# Patient Record
Sex: Female | Born: 1945
Health system: Southern US, Community
[De-identification: ages and names within clinical notes are randomized; demographics above are authoritative.]

## PROBLEM LIST (undated history)

## (undated) DIAGNOSIS — G4700 Insomnia, unspecified: Secondary | ICD-10-CM

## (undated) DIAGNOSIS — E119 Type 2 diabetes mellitus without complications: Secondary | ICD-10-CM

## (undated) DIAGNOSIS — I10 Essential (primary) hypertension: Secondary | ICD-10-CM

## (undated) DIAGNOSIS — M810 Age-related osteoporosis without current pathological fracture: Secondary | ICD-10-CM

---

## 2007-03-28 ENCOUNTER — Ambulatory Visit: Payer: Self-pay | Admitting: Hematology and Oncology

## 2007-04-08 LAB — CBC & DIFF AND RETIC
Basophils Absolute: 0.1 10*3/uL (ref 0.0–0.1)
Eosinophils Absolute: 0.4 10*3/uL (ref 0.0–0.5)
LYMPH%: 26.7 % (ref 14.0–48.0)
MCV: 86.7 fL (ref 81.0–101.0)
MONO%: 7 % (ref 0.0–13.0)
NEUT#: 5 10*3/uL (ref 1.5–6.5)
NEUT%: 61 % (ref 39.6–76.8)
Platelets: 365 10*3/uL (ref 145–400)
RBC: 4.63 10*6/uL (ref 3.70–5.32)
Retic %: 1.2 % (ref 0.4–2.3)

## 2007-04-11 LAB — INTRINSIC FACTOR ANTIBODIES: Intrinsic Factor: POSITIVE — AB

## 2007-04-12 LAB — PROTEIN ELECTROPHORESIS, SERUM
Alpha-2-Globulin: 9.7 % (ref 7.1–11.8)
Gamma Globulin: 12.7 % (ref 11.1–18.8)

## 2007-04-12 LAB — IRON AND TIBC
%SAT: 15 % — ABNORMAL LOW (ref 20–55)
Iron: 60 ug/dL (ref 42–145)
TIBC: 402 ug/dL (ref 250–470)
UIBC: 342 ug/dL

## 2007-04-12 LAB — COMPREHENSIVE METABOLIC PANEL
ALT: 25 U/L (ref 0–35)
Albumin: 5.3 g/dL — ABNORMAL HIGH (ref 3.5–5.2)
Alkaline Phosphatase: 83 U/L (ref 39–117)
BUN: 12 mg/dL (ref 6–23)
Calcium: 10.3 mg/dL (ref 8.4–10.5)
Glucose, Bld: 117 mg/dL — ABNORMAL HIGH (ref 70–99)
Total Bilirubin: 1 mg/dL (ref 0.3–1.2)
Total Protein: 7.7 g/dL (ref 6.0–8.3)

## 2007-04-12 LAB — FERRITIN: Ferritin: 42 ng/mL (ref 10–291)

## 2007-07-29 ENCOUNTER — Ambulatory Visit: Payer: Self-pay | Admitting: Hematology and Oncology

## 2007-08-02 LAB — CBC WITH DIFFERENTIAL/PLATELET
BASO%: 0.7 % (ref 0.0–2.0)
Eosinophils Absolute: 0.4 10*3/uL (ref 0.0–0.5)
HCT: 37 % (ref 34.8–46.6)
LYMPH%: 28.5 % (ref 14.0–48.0)
MCHC: 35.3 g/dL (ref 32.0–36.0)
MCV: 89.8 fL (ref 81.0–101.0)
MONO#: 0.4 10*3/uL (ref 0.1–0.9)
NEUT%: 60.3 % (ref 39.6–76.8)
Platelets: 334 10*3/uL (ref 145–400)
WBC: 7.7 10*3/uL (ref 3.9–10.0)

## 2007-08-03 LAB — BASIC METABOLIC PANEL
CO2: 26 mEq/L (ref 19–32)
Calcium: 9.8 mg/dL (ref 8.4–10.5)
Creatinine, Ser: 0.82 mg/dL (ref 0.40–1.20)
Glucose, Bld: 110 mg/dL — ABNORMAL HIGH (ref 70–99)

## 2007-08-03 LAB — FOLATE RBC: RBC Folate: 954 ng/mL — ABNORMAL HIGH (ref 180–600)

## 2011-07-28 ENCOUNTER — Other Ambulatory Visit (HOSPITAL_COMMUNITY): Payer: Self-pay | Admitting: *Deleted

## 2011-07-31 ENCOUNTER — Ambulatory Visit (HOSPITAL_COMMUNITY)
Admission: RE | Admit: 2011-07-31 | Discharge: 2011-07-31 | Disposition: A | Discharge status: Home / Self Care | Payer: BC Managed Care – PPO | Source: Ambulatory Visit | Attending: Internal Medicine | Admitting: Internal Medicine

## 2011-07-31 DIAGNOSIS — M810 Age-related osteoporosis without current pathological fracture: Secondary | ICD-10-CM | POA: Insufficient documentation

## 2011-07-31 MED ORDER — ZOLEDRONIC ACID 5 MG/100ML IV SOLN
5.0000 mg | Freq: Once | INTRAVENOUS | Status: AC
  Administered 2011-07-31: 5 mg via INTRAVENOUS
  Filled 2011-07-31: qty 100

## 2011-07-31 NOTE — Progress Notes (Signed)
1035- Attempted two PIV, one in left antecubital and one in right forearm.  Both attempts unsuccessful.  Another RN to assess.

## 2011-07-31 NOTE — Progress Notes (Signed)
Right FA PIV attempted without success

## 2011-08-03 ENCOUNTER — Other Ambulatory Visit (HOSPITAL_COMMUNITY): Payer: Self-pay | Admitting: *Deleted

## 2011-08-03 MED ORDER — SODIUM CHLORIDE 0.9 % IV SOLN: INTRAVENOUS | Status: AC

## 2011-08-04 ENCOUNTER — Encounter (HOSPITAL_COMMUNITY): Admission: RE | Admit: 2011-08-04 | Payer: BC Managed Care – PPO | Source: Ambulatory Visit

## 2011-08-04 ENCOUNTER — Other Ambulatory Visit (HOSPITAL_COMMUNITY): Payer: Self-pay | Admitting: *Deleted

## 2012-06-01 ENCOUNTER — Other Ambulatory Visit: Payer: Self-pay | Admitting: Obstetrics and Gynecology

## 2012-06-01 DIAGNOSIS — R928 Other abnormal and inconclusive findings on diagnostic imaging of breast: Secondary | ICD-10-CM

## 2012-06-03 ENCOUNTER — Ambulatory Visit
Admission: RE | Admit: 2012-06-03 | Discharge: 2012-06-03 | Disposition: A | Discharge status: Home / Self Care | Payer: BC Managed Care – PPO | Source: Ambulatory Visit | Attending: Obstetrics and Gynecology | Admitting: Obstetrics and Gynecology

## 2012-06-03 DIAGNOSIS — R928 Other abnormal and inconclusive findings on diagnostic imaging of breast: Secondary | ICD-10-CM

## 2012-10-07 ENCOUNTER — Other Ambulatory Visit (HOSPITAL_COMMUNITY): Payer: Self-pay | Admitting: Internal Medicine

## 2012-10-11 ENCOUNTER — Encounter (HOSPITAL_COMMUNITY): Payer: Self-pay

## 2012-10-11 ENCOUNTER — Ambulatory Visit (HOSPITAL_COMMUNITY)
Admission: RE | Admit: 2012-10-11 | Discharge: 2012-10-11 | Disposition: A | Discharge status: Home / Self Care | Payer: BC Managed Care – PPO | Source: Ambulatory Visit | Attending: Internal Medicine | Admitting: Internal Medicine

## 2012-10-11 DIAGNOSIS — M810 Age-related osteoporosis without current pathological fracture: Secondary | ICD-10-CM | POA: Insufficient documentation

## 2012-10-11 HISTORY — DX: Insomnia, unspecified: G47.00

## 2012-10-11 HISTORY — DX: Age-related osteoporosis without current pathological fracture: M81.0

## 2012-10-11 HISTORY — DX: Essential (primary) hypertension: I10

## 2012-10-11 HISTORY — DX: Type 2 diabetes mellitus without complications: E11.9

## 2012-10-11 MED ORDER — SODIUM CHLORIDE 0.9 % IV SOLN
Freq: Once | INTRAVENOUS | Status: AC
  Administered 2012-10-11: 12:00:00 via INTRAVENOUS

## 2012-10-11 MED ORDER — ZOLEDRONIC ACID 5 MG/100ML IV SOLN
5.0000 mg | Freq: Once | INTRAVENOUS | Status: AC
  Administered 2012-10-11: 5 mg via INTRAVENOUS
  Filled 2012-10-11: qty 100

## 2012-10-13 ENCOUNTER — Other Ambulatory Visit (HOSPITAL_COMMUNITY): Payer: Self-pay | Admitting: Internal Medicine

## 2012-10-17 ENCOUNTER — Ambulatory Visit (HOSPITAL_COMMUNITY): Payer: BC Managed Care – PPO

## 2014-07-02 ENCOUNTER — Encounter (HOSPITAL_COMMUNITY): Payer: BC Managed Care – PPO

## 2017-08-20 ENCOUNTER — Encounter: Payer: Self-pay | Admitting: Family Medicine

## 2017-08-20 ENCOUNTER — Ambulatory Visit: Payer: Medicare Other | Admitting: Family Medicine

## 2017-08-20 VITALS — BP 124/68 | HR 82 | Temp 98.6°F | Ht 64.5 in | Wt 126.0 lb

## 2017-08-20 DIAGNOSIS — M7591 Shoulder lesion, unspecified, right shoulder: Secondary | ICD-10-CM | POA: Diagnosis not present

## 2017-08-20 DIAGNOSIS — M19019 Primary osteoarthritis, unspecified shoulder: Secondary | ICD-10-CM

## 2017-08-20 MED ORDER — NITROGLYCERIN 0.2 MG/HR TD PT24: MEDICATED_PATCH | TRANSDERMAL | 11 refills | Status: AC

## 2017-08-20 NOTE — Progress Notes (Signed)
Lucendia Leard - 72 y.o. female MRN [id number]  Date of birth: [birth year]  SUBJECTIVE:  Including CC & ROS.  Chief Complaint  Patient presents with  . Right shoulder pain    Sherrice Creekmore is a 72 y.o. female that is presenting with right shoulder pain. Pain present since October. Pain is located diffusely throughout her shoulder. She has been seeing a Physical therapist for the neck and shoulder pain. Denies surgeries or injuries to the area. Pain more intense upon lifting her arm with any weight in it. She is active and plays pickleball. The pain is sharp and moderate. Has some pain with overhead activities. No radicular symptoms. No bruising or swelling. Has not taken any medications. Pain is intermittent in nature.    Review of Systems  Constitutional: Negative for fever.  Respiratory: Negative for shortness of breath.   Gastrointestinal: Negative for abdominal pain.  Musculoskeletal: Negative for back pain.  Skin: Negative for color change.  Allergic/Immunologic: Negative for immunocompromised state.  Neurological: Negative for weakness.  Hematological: Negative for adenopathy.  Psychiatric/Behavioral: Negative for agitation.    HISTORY: Past Medical, Surgical, Social, and Family History Reviewed & Updated per EMR.   Pertinent Historical Findings include:  Past Medical History:  Diagnosis Date  . Diabetes (HCC)   . Hypertension   . Insomnia   . Osteoporosis     History reviewed. No pertinent surgical history.  Allergies  Allergen Reactions  . Codeine Nausea Only  . Crestor [Rosuvastatin Calcium] Nausea Only  . Lipitor [Atorvastatin Calcium] Other (See Comments)    Liver muscle problems  . Metformin And Related Other (See Comments)    Bocks iron and b12  . Tramadol Other (See Comments)    vertigo  . Uncoded Nonscreenable Allergen     opiods and polysporin  . Amoxicillin-Pot Clavulanate Rash  . Penicillins Rash    Taken from dr Leandro Reasoner faxed note (pt states only to  augmentin  . Polysporin [Bacitracin-Polymyxin B] Itching and Rash    Taken from dr Leandro Reasoner faxed notes/     History reviewed. No pertinent family history.   Social History   Socioeconomic History  . Marital status: Married    Spouse name: Not on file  . Number of children: Not on file  . Years of education: Not on file  . Highest education level: Not on file  Social Needs  . Financial resource strain: Not on file  . Food insecurity - worry: Not on file  . Food insecurity - inability: Not on file  . Transportation needs - medical: Not on file  . Transportation needs - non-medical: Not on file  Occupational History  . Not on file  Tobacco Use  . Smoking status: Never Smoker  . Smokeless tobacco: Never Used  Substance and Sexual Activity  . Alcohol use: No    Frequency: Never  . Drug use: No  . Sexual activity: Not on file  Other Topics Concern  . Not on file  Social History Narrative  . Not on file     PHYSICAL EXAM:  VS: BP 124/68 (BP Location: Left Arm, Patient Position: Sitting, Cuff Size: Normal)   Pulse 82   Temp 98.6 F (37 C) (Oral)   Ht 5' 4.5" (1.638 m)   Wt 126 lb (57.2 kg)   SpO2 100%   BMI 21.29 kg/m  Physical Exam Gen: NAD, alert, cooperative with exam, well-appearing ENT: normal lips, normal nasal mucosa,  Eye: normal EOM, normal conjunctiva and lids CV:  no edema, +2 pedal pulses   Resp: no accessory muscle use, non-labored,  Skin: no rashes, no areas of induration  Neuro: normal tone, normal sensation to touch Psych:  normal insight, alert and oriented MSK:  Right Shoulder:  Normal active flexion and abduction  Normal ER and abduction  Normal strength to resistance with IR and ER  Normal Empty can testing  Normal Speed's test.  Neurovascularly intact.   Limited ultrasound: right shoulder:  BT was normal in short axis and in dynamic testing  Normal subscapularis and infraspinatus.  Chronic changes of the supraspinatus to suggest  tendinopathy. No obvious enlarged bursa  Normal posterior GH  AC joint with degenerative changes and effusion   Summary: supraspinatus tendinopathy and AC joint arthritis.   Ultrasound and interpretation by Clare GandyJeremy Felma Pfefferle, MD       Aspiration/Injection Procedure Note Elyn Peersancy Kurdziel [birth year]  Procedure: Injection Indications: right shoulder pain  Procedure Details Consent: Risks of procedure as well as the alternatives and risks of each were explained to the (patient/caregiver).  Consent for procedure obtained. Time Out: Verified patient identification, verified procedure, site/side was marked, verified correct patient position, special equipment/implants available, medications/allergies/relevent history reviewed, required imaging and test results available.  Performed.  The area was cleaned with iodine and alcohol swabs.    The right AC joint was injected using 0.5 cc's of 40 mg Depomedrol and 4 cc's of 1% lidocaine with a 22 1 1/2" needle.  Ultrasound was used. Images were obtained in  Long views showing the injection.    A sterile dressing was applied.  Patient did tolerate procedure well.        ASSESSMENT & PLAN:   Right supraspinatus tendinitis Appears that she has some tendinopathy on ultrasound.  Exam was strong throughout. -Nitro -Counseled on home exercises -Follow-up in 4-6 weeks to rescan.  AC joint arthropathy Pain with overhead activities and changes on US.  - AC joint injection today  - could try a topical NSAID.

## 2017-08-20 NOTE — Patient Instructions (Signed)

## 2017-08-20 NOTE — Assessment & Plan Note (Signed)
Pain with overhead activities and changes on US.  - AC joint injection today  - could try a topical NSAID.

## 2017-08-20 NOTE — Assessment & Plan Note (Signed)
Appears that she has some tendinopathy on ultrasound.  Exam was strong throughout. -Nitro -Counseled on home exercises -Follow-up in 4-6 weeks to rescan.

## 2017-09-17 ENCOUNTER — Encounter: Payer: Self-pay | Admitting: Family Medicine

## 2017-09-17 ENCOUNTER — Ambulatory Visit: Payer: Medicare Other | Admitting: Family Medicine

## 2017-09-17 VITALS — BP 110/66 | HR 78 | Temp 97.7°F | Ht 64.5 in | Wt 128.0 lb

## 2017-09-17 DIAGNOSIS — M7591 Shoulder lesion, unspecified, right shoulder: Secondary | ICD-10-CM

## 2017-09-17 DIAGNOSIS — M19019 Primary osteoarthritis, unspecified shoulder: Secondary | ICD-10-CM | POA: Diagnosis not present

## 2017-09-17 NOTE — Progress Notes (Signed)
Jillian Peersancy Junker - 72 y.o. female MRN [id number]  Date of birth: 1946-07-14  SUBJECTIVE:  Including CC & ROS.  Chief Complaint  Patient presents with  . Follow-up    Jillian Simon is a 72 y.o. female that is here for right supraspinatus tendinopathy and AC joint injection follow up. Patient states pain is improved. She has been using Nitro patches and exercises with improvement. She hasn't been playing pickle ball. Has noticed improvement in her range of motion. Has been working with PT.   Review of Systems  Constitutional: Negative for fever.  HENT: Negative for ear pain.   Respiratory: Negative for cough.   Cardiovascular: Negative for chest pain.  Gastrointestinal: Negative for abdominal pain.  Musculoskeletal: Negative for back pain.  Skin: Negative for color change.  Neurological: Negative for weakness.  Hematological: Negative for adenopathy.  Psychiatric/Behavioral: Negative for agitation.    HISTORY: Past Medical, Surgical, Social, and Family History Reviewed & Updated per EMR.   Pertinent Historical Findings include:  Past Medical History:  Diagnosis Date  . Diabetes (HCC)   . Hypertension   . Insomnia   . Osteoporosis     No past surgical history on file.  Allergies  Allergen Reactions  . Codeine Nausea Only  . Crestor [Rosuvastatin Calcium] Nausea Only  . Lipitor [Atorvastatin Calcium] Other (See Comments)    Liver muscle problems  . Metformin And Related Other (See Comments)    Bocks iron and b12  . Tramadol Other (See Comments)    vertigo  . Uncoded Nonscreenable Allergen     opiods and polysporin  . Amoxicillin-Pot Clavulanate Rash  . Penicillins Rash    Taken from dr Leandro Reasonershaws faxed note (pt states only to augmentin  . Polysporin [Bacitracin-Polymyxin B] Itching and Rash    Taken from dr Leandro Reasonershaws faxed notes/     No family history on file.   Social History   Socioeconomic History  . Marital status: Married    Spouse name: Not on file  . Number of  children: Not on file  . Years of education: Not on file  . Highest education level: Not on file  Social Needs  . Financial resource strain: Not on file  . Food insecurity - worry: Not on file  . Food insecurity - inability: Not on file  . Transportation needs - medical: Not on file  . Transportation needs - non-medical: Not on file  Occupational History  . Not on file  Tobacco Use  . Smoking status: Never Smoker  . Smokeless tobacco: Never Used  Substance and Sexual Activity  . Alcohol use: No    Frequency: Never  . Drug use: No  . Sexual activity: Not on file  Other Topics Concern  . Not on file  Social History Narrative  . Not on file     PHYSICAL EXAM:  VS: BP 110/66 (BP Location: Left Arm, Patient Position: Sitting, Cuff Size: Normal)   Pulse 78   Temp 97.7 F (36.5 C) (Oral)   Ht 5' 4.5" (1.638 m)   Wt 128 lb (58.1 kg)   SpO2 97%   BMI 21.63 kg/m  Physical Exam Gen: NAD, alert, cooperative with exam, well-appearing ENT: normal lips, normal nasal mucosa,  Eye: normal EOM, normal conjunctiva and lids CV:  no edema, +2 pedal pulses   Resp: no accessory muscle use, non-labored,  GI: no masses or tenderness, no hernia  Skin: no rashes, no areas of induration  Neuro: normal tone, normal sensation to touch  Psych:  normal insight, alert and oriented MSK:  Right shoulder:  No TTP of the AC joint  Normal ROM  Normal strength to resistance with IR and ER  Normal Hawkin's test  Normal cross arm test  Neurovascularly intact   Limited ultrasound: right and left shoulder:  Right shoulder:  Normal appearing BT, subscapularis and supraspinatus  AC without significant effusion   Left shoulder:  Normal appearing supraspinatus  AC joint with arthropathy and mild effusion   Summary: normal appearing right shoulder.   Ultrasound and interpretation by Clare Gandy, MD        ASSESSMENT & PLAN:   AC joint arthropathy Significant improvement with injection   - provided Pennsaid  - f/u PRN   Right supraspinatus tendinitis Has had improvement with nitro patches  - continue nitro patches for total of 3 months - continue with exercises  - f/u PRN

## 2017-09-17 NOTE — Patient Instructions (Signed)
Please try to perform the shoulder exercises at least every other day  Please try the Pennsaid if your shoulder pain flares up  You can use the nitro patches up to three months.  Follow up with me if you feel your pain worsens or changes.  Have a good trip to the beach.

## 2017-09-17 NOTE — Assessment & Plan Note (Addendum)
Significant improvement with injection  - provided Pennsaid  - f/u PRN

## 2017-09-17 NOTE — Assessment & Plan Note (Signed)
Has had improvement with nitro patches  - continue nitro patches for total of 3 months - continue with exercises  - f/u PRN

## 2018-08-18 ENCOUNTER — Encounter (INDEPENDENT_AMBULATORY_CARE_PROVIDER_SITE_OTHER): Payer: Medicare Other | Admitting: Ophthalmology

## 2018-08-18 DIAGNOSIS — H35033 Hypertensive retinopathy, bilateral: Secondary | ICD-10-CM | POA: Diagnosis not present

## 2018-08-18 DIAGNOSIS — H353132 Nonexudative age-related macular degeneration, bilateral, intermediate dry stage: Secondary | ICD-10-CM

## 2018-08-18 DIAGNOSIS — H43813 Vitreous degeneration, bilateral: Secondary | ICD-10-CM

## 2018-08-18 DIAGNOSIS — I10 Essential (primary) hypertension: Secondary | ICD-10-CM | POA: Diagnosis not present

## 2019-07-21 ENCOUNTER — Other Ambulatory Visit: Payer: Self-pay

## 2019-07-21 ENCOUNTER — Encounter (INDEPENDENT_AMBULATORY_CARE_PROVIDER_SITE_OTHER): Payer: Medicare Other | Admitting: Ophthalmology

## 2019-07-21 DIAGNOSIS — H43813 Vitreous degeneration, bilateral: Secondary | ICD-10-CM | POA: Diagnosis not present

## 2019-07-21 DIAGNOSIS — H353132 Nonexudative age-related macular degeneration, bilateral, intermediate dry stage: Secondary | ICD-10-CM

## 2019-07-21 DIAGNOSIS — H35033 Hypertensive retinopathy, bilateral: Secondary | ICD-10-CM

## 2019-07-21 DIAGNOSIS — I10 Essential (primary) hypertension: Secondary | ICD-10-CM

## 2019-08-24 ENCOUNTER — Encounter (INDEPENDENT_AMBULATORY_CARE_PROVIDER_SITE_OTHER): Payer: Medicare Other | Admitting: Ophthalmology

## 2019-09-12 ENCOUNTER — Ambulatory Visit: Payer: Medicare Other

## 2019-09-29 ENCOUNTER — Ambulatory Visit: Payer: Medicare Other

## 2019-11-20 ENCOUNTER — Other Ambulatory Visit: Payer: Self-pay

## 2019-11-20 ENCOUNTER — Encounter (INDEPENDENT_AMBULATORY_CARE_PROVIDER_SITE_OTHER): Payer: Medicare PPO | Admitting: Ophthalmology

## 2019-11-20 DIAGNOSIS — I10 Essential (primary) hypertension: Secondary | ICD-10-CM

## 2019-11-20 DIAGNOSIS — H35033 Hypertensive retinopathy, bilateral: Secondary | ICD-10-CM | POA: Diagnosis not present

## 2019-11-20 DIAGNOSIS — H43813 Vitreous degeneration, bilateral: Secondary | ICD-10-CM

## 2019-11-20 DIAGNOSIS — H353132 Nonexudative age-related macular degeneration, bilateral, intermediate dry stage: Secondary | ICD-10-CM

## 2020-01-18 DIAGNOSIS — H40051 Ocular hypertension, right eye: Secondary | ICD-10-CM | POA: Diagnosis not present

## 2020-01-18 DIAGNOSIS — H40023 Open angle with borderline findings, high risk, bilateral: Secondary | ICD-10-CM | POA: Diagnosis not present

## 2020-01-31 DIAGNOSIS — M25512 Pain in left shoulder: Secondary | ICD-10-CM | POA: Diagnosis not present

## 2020-01-31 DIAGNOSIS — M542 Cervicalgia: Secondary | ICD-10-CM | POA: Diagnosis not present

## 2020-02-14 DIAGNOSIS — H353132 Nonexudative age-related macular degeneration, bilateral, intermediate dry stage: Secondary | ICD-10-CM | POA: Diagnosis not present

## 2020-02-21 DIAGNOSIS — M25512 Pain in left shoulder: Secondary | ICD-10-CM | POA: Diagnosis not present

## 2020-02-21 DIAGNOSIS — M542 Cervicalgia: Secondary | ICD-10-CM | POA: Diagnosis not present

## 2020-03-13 DIAGNOSIS — M542 Cervicalgia: Secondary | ICD-10-CM | POA: Diagnosis not present

## 2020-03-13 DIAGNOSIS — M25512 Pain in left shoulder: Secondary | ICD-10-CM | POA: Diagnosis not present

## 2020-03-15 DIAGNOSIS — H353132 Nonexudative age-related macular degeneration, bilateral, intermediate dry stage: Secondary | ICD-10-CM | POA: Diagnosis not present

## 2020-04-03 DIAGNOSIS — M542 Cervicalgia: Secondary | ICD-10-CM | POA: Diagnosis not present

## 2020-04-03 DIAGNOSIS — M25512 Pain in left shoulder: Secondary | ICD-10-CM | POA: Diagnosis not present

## 2020-04-14 DIAGNOSIS — H353132 Nonexudative age-related macular degeneration, bilateral, intermediate dry stage: Secondary | ICD-10-CM | POA: Diagnosis not present

## 2020-04-26 DIAGNOSIS — M25512 Pain in left shoulder: Secondary | ICD-10-CM | POA: Diagnosis not present

## 2020-04-26 DIAGNOSIS — M542 Cervicalgia: Secondary | ICD-10-CM | POA: Diagnosis not present

## 2020-05-01 DIAGNOSIS — Z08 Encounter for follow-up examination after completed treatment for malignant neoplasm: Secondary | ICD-10-CM | POA: Diagnosis not present

## 2020-05-01 DIAGNOSIS — D229 Melanocytic nevi, unspecified: Secondary | ICD-10-CM | POA: Diagnosis not present

## 2020-05-01 DIAGNOSIS — L821 Other seborrheic keratosis: Secondary | ICD-10-CM | POA: Diagnosis not present

## 2020-05-01 DIAGNOSIS — Z85828 Personal history of other malignant neoplasm of skin: Secondary | ICD-10-CM | POA: Diagnosis not present

## 2020-05-01 DIAGNOSIS — I781 Nevus, non-neoplastic: Secondary | ICD-10-CM | POA: Diagnosis not present

## 2020-05-06 DIAGNOSIS — H04322 Acute dacryocystitis of left lacrimal passage: Secondary | ICD-10-CM | POA: Diagnosis not present

## 2020-05-14 DIAGNOSIS — H353132 Nonexudative age-related macular degeneration, bilateral, intermediate dry stage: Secondary | ICD-10-CM | POA: Diagnosis not present

## 2020-05-15 DIAGNOSIS — M542 Cervicalgia: Secondary | ICD-10-CM | POA: Diagnosis not present

## 2020-05-15 DIAGNOSIS — M25512 Pain in left shoulder: Secondary | ICD-10-CM | POA: Diagnosis not present

## 2020-05-21 ENCOUNTER — Encounter (INDEPENDENT_AMBULATORY_CARE_PROVIDER_SITE_OTHER): Payer: Medicare PPO | Admitting: Ophthalmology

## 2020-05-21 ENCOUNTER — Other Ambulatory Visit: Payer: Self-pay

## 2020-05-21 DIAGNOSIS — H35033 Hypertensive retinopathy, bilateral: Secondary | ICD-10-CM

## 2020-05-21 DIAGNOSIS — I10 Essential (primary) hypertension: Secondary | ICD-10-CM

## 2020-05-21 DIAGNOSIS — H353132 Nonexudative age-related macular degeneration, bilateral, intermediate dry stage: Secondary | ICD-10-CM

## 2020-05-21 DIAGNOSIS — H43813 Vitreous degeneration, bilateral: Secondary | ICD-10-CM | POA: Diagnosis not present

## 2020-05-28 DIAGNOSIS — E1129 Type 2 diabetes mellitus with other diabetic kidney complication: Secondary | ICD-10-CM | POA: Diagnosis not present

## 2020-05-28 DIAGNOSIS — Z0000 Encounter for general adult medical examination without abnormal findings: Secondary | ICD-10-CM | POA: Diagnosis not present

## 2020-05-28 DIAGNOSIS — D649 Anemia, unspecified: Secondary | ICD-10-CM | POA: Diagnosis not present

## 2020-05-28 DIAGNOSIS — E785 Hyperlipidemia, unspecified: Secondary | ICD-10-CM | POA: Diagnosis not present

## 2020-05-28 DIAGNOSIS — I10 Essential (primary) hypertension: Secondary | ICD-10-CM | POA: Diagnosis not present

## 2020-05-28 DIAGNOSIS — E538 Deficiency of other specified B group vitamins: Secondary | ICD-10-CM | POA: Diagnosis not present

## 2020-06-05 DIAGNOSIS — M542 Cervicalgia: Secondary | ICD-10-CM | POA: Diagnosis not present

## 2020-06-05 DIAGNOSIS — M25512 Pain in left shoulder: Secondary | ICD-10-CM | POA: Diagnosis not present

## 2020-06-06 DIAGNOSIS — E1129 Type 2 diabetes mellitus with other diabetic kidney complication: Secondary | ICD-10-CM | POA: Diagnosis not present

## 2020-06-06 DIAGNOSIS — R82998 Other abnormal findings in urine: Secondary | ICD-10-CM | POA: Diagnosis not present

## 2020-06-06 DIAGNOSIS — Z1331 Encounter for screening for depression: Secondary | ICD-10-CM | POA: Diagnosis not present

## 2020-06-06 DIAGNOSIS — I10 Essential (primary) hypertension: Secondary | ICD-10-CM | POA: Diagnosis not present

## 2020-06-06 DIAGNOSIS — R1319 Other dysphagia: Secondary | ICD-10-CM | POA: Diagnosis not present

## 2020-06-06 DIAGNOSIS — Z23 Encounter for immunization: Secondary | ICD-10-CM | POA: Diagnosis not present

## 2020-06-06 DIAGNOSIS — Z0000 Encounter for general adult medical examination without abnormal findings: Secondary | ICD-10-CM | POA: Diagnosis not present

## 2020-06-06 DIAGNOSIS — K581 Irritable bowel syndrome with constipation: Secondary | ICD-10-CM | POA: Diagnosis not present

## 2020-06-06 DIAGNOSIS — E785 Hyperlipidemia, unspecified: Secondary | ICD-10-CM | POA: Diagnosis not present

## 2020-06-06 DIAGNOSIS — Z1339 Encounter for screening examination for other mental health and behavioral disorders: Secondary | ICD-10-CM | POA: Diagnosis not present

## 2020-06-06 DIAGNOSIS — E538 Deficiency of other specified B group vitamins: Secondary | ICD-10-CM | POA: Diagnosis not present

## 2020-06-06 DIAGNOSIS — E611 Iron deficiency: Secondary | ICD-10-CM | POA: Diagnosis not present

## 2020-06-06 DIAGNOSIS — Z1212 Encounter for screening for malignant neoplasm of rectum: Secondary | ICD-10-CM | POA: Diagnosis not present

## 2020-06-06 DIAGNOSIS — R809 Proteinuria, unspecified: Secondary | ICD-10-CM | POA: Diagnosis not present

## 2020-06-06 DIAGNOSIS — M8580 Other specified disorders of bone density and structure, unspecified site: Secondary | ICD-10-CM | POA: Diagnosis not present

## 2020-06-13 DIAGNOSIS — H353132 Nonexudative age-related macular degeneration, bilateral, intermediate dry stage: Secondary | ICD-10-CM | POA: Diagnosis not present

## 2020-06-21 DIAGNOSIS — R195 Other fecal abnormalities: Secondary | ICD-10-CM | POA: Diagnosis not present

## 2020-06-21 DIAGNOSIS — D500 Iron deficiency anemia secondary to blood loss (chronic): Secondary | ICD-10-CM | POA: Diagnosis not present

## 2020-06-21 DIAGNOSIS — R1013 Epigastric pain: Secondary | ICD-10-CM | POA: Diagnosis not present

## 2020-06-26 DIAGNOSIS — M542 Cervicalgia: Secondary | ICD-10-CM | POA: Diagnosis not present

## 2020-06-26 DIAGNOSIS — Z1159 Encounter for screening for other viral diseases: Secondary | ICD-10-CM | POA: Diagnosis not present

## 2020-07-01 DIAGNOSIS — D509 Iron deficiency anemia, unspecified: Secondary | ICD-10-CM | POA: Diagnosis not present

## 2020-07-01 DIAGNOSIS — R195 Other fecal abnormalities: Secondary | ICD-10-CM | POA: Diagnosis not present

## 2020-07-13 DIAGNOSIS — H353132 Nonexudative age-related macular degeneration, bilateral, intermediate dry stage: Secondary | ICD-10-CM | POA: Diagnosis not present

## 2020-07-17 DIAGNOSIS — M542 Cervicalgia: Secondary | ICD-10-CM | POA: Diagnosis not present

## 2020-07-18 DIAGNOSIS — H353132 Nonexudative age-related macular degeneration, bilateral, intermediate dry stage: Secondary | ICD-10-CM | POA: Diagnosis not present

## 2020-07-18 DIAGNOSIS — H3554 Dystrophies primarily involving the retinal pigment epithelium: Secondary | ICD-10-CM | POA: Diagnosis not present

## 2020-07-18 DIAGNOSIS — H40023 Open angle with borderline findings, high risk, bilateral: Secondary | ICD-10-CM | POA: Diagnosis not present

## 2020-07-18 DIAGNOSIS — E119 Type 2 diabetes mellitus without complications: Secondary | ICD-10-CM | POA: Diagnosis not present

## 2020-07-24 DIAGNOSIS — D649 Anemia, unspecified: Secondary | ICD-10-CM | POA: Diagnosis not present

## 2020-07-24 DIAGNOSIS — D500 Iron deficiency anemia secondary to blood loss (chronic): Secondary | ICD-10-CM | POA: Diagnosis not present

## 2020-08-05 DIAGNOSIS — M542 Cervicalgia: Secondary | ICD-10-CM | POA: Diagnosis not present

## 2020-08-12 DIAGNOSIS — H353132 Nonexudative age-related macular degeneration, bilateral, intermediate dry stage: Secondary | ICD-10-CM | POA: Diagnosis not present

## 2020-08-26 DIAGNOSIS — M542 Cervicalgia: Secondary | ICD-10-CM | POA: Diagnosis not present

## 2020-09-09 DIAGNOSIS — D649 Anemia, unspecified: Secondary | ICD-10-CM | POA: Diagnosis not present

## 2020-09-11 DIAGNOSIS — H353132 Nonexudative age-related macular degeneration, bilateral, intermediate dry stage: Secondary | ICD-10-CM | POA: Diagnosis not present

## 2020-09-16 DIAGNOSIS — M542 Cervicalgia: Secondary | ICD-10-CM | POA: Diagnosis not present

## 2020-09-17 DIAGNOSIS — R195 Other fecal abnormalities: Secondary | ICD-10-CM | POA: Diagnosis not present

## 2020-09-17 DIAGNOSIS — D509 Iron deficiency anemia, unspecified: Secondary | ICD-10-CM | POA: Diagnosis not present

## 2020-10-07 DIAGNOSIS — Z01812 Encounter for preprocedural laboratory examination: Secondary | ICD-10-CM | POA: Diagnosis not present

## 2020-10-07 DIAGNOSIS — M542 Cervicalgia: Secondary | ICD-10-CM | POA: Diagnosis not present

## 2020-10-10 DIAGNOSIS — D509 Iron deficiency anemia, unspecified: Secondary | ICD-10-CM | POA: Diagnosis not present

## 2020-10-11 DIAGNOSIS — H353132 Nonexudative age-related macular degeneration, bilateral, intermediate dry stage: Secondary | ICD-10-CM | POA: Diagnosis not present

## 2020-10-17 ENCOUNTER — Ambulatory Visit
Admission: RE | Admit: 2020-10-17 | Discharge: 2020-10-17 | Disposition: A | Discharge status: Home / Self Care | Payer: Medicare PPO | Source: Ambulatory Visit | Attending: Gastroenterology | Admitting: Gastroenterology

## 2020-10-17 ENCOUNTER — Other Ambulatory Visit: Payer: Self-pay | Admitting: Gastroenterology

## 2020-10-17 DIAGNOSIS — Z9889 Other specified postprocedural states: Secondary | ICD-10-CM | POA: Diagnosis not present

## 2020-10-17 DIAGNOSIS — D509 Iron deficiency anemia, unspecified: Secondary | ICD-10-CM

## 2020-10-17 DIAGNOSIS — M4186 Other forms of scoliosis, lumbar region: Secondary | ICD-10-CM | POA: Diagnosis not present

## 2020-10-17 DIAGNOSIS — M5136 Other intervertebral disc degeneration, lumbar region: Secondary | ICD-10-CM | POA: Diagnosis not present

## 2020-10-30 DIAGNOSIS — M542 Cervicalgia: Secondary | ICD-10-CM | POA: Diagnosis not present

## 2020-11-10 DIAGNOSIS — H353132 Nonexudative age-related macular degeneration, bilateral, intermediate dry stage: Secondary | ICD-10-CM | POA: Diagnosis not present

## 2020-11-19 ENCOUNTER — Other Ambulatory Visit: Payer: Self-pay

## 2020-11-19 ENCOUNTER — Encounter (INDEPENDENT_AMBULATORY_CARE_PROVIDER_SITE_OTHER): Payer: Medicare PPO | Admitting: Ophthalmology

## 2020-11-19 DIAGNOSIS — H35033 Hypertensive retinopathy, bilateral: Secondary | ICD-10-CM | POA: Diagnosis not present

## 2020-11-19 DIAGNOSIS — I10 Essential (primary) hypertension: Secondary | ICD-10-CM | POA: Diagnosis not present

## 2020-11-19 DIAGNOSIS — H353132 Nonexudative age-related macular degeneration, bilateral, intermediate dry stage: Secondary | ICD-10-CM | POA: Diagnosis not present

## 2020-11-19 DIAGNOSIS — H43813 Vitreous degeneration, bilateral: Secondary | ICD-10-CM | POA: Diagnosis not present

## 2020-11-20 DIAGNOSIS — M542 Cervicalgia: Secondary | ICD-10-CM | POA: Diagnosis not present

## 2020-12-04 DIAGNOSIS — Z6821 Body mass index (BMI) 21.0-21.9, adult: Secondary | ICD-10-CM | POA: Diagnosis not present

## 2020-12-04 DIAGNOSIS — Z01419 Encounter for gynecological examination (general) (routine) without abnormal findings: Secondary | ICD-10-CM | POA: Diagnosis not present

## 2020-12-04 DIAGNOSIS — Z1231 Encounter for screening mammogram for malignant neoplasm of breast: Secondary | ICD-10-CM | POA: Diagnosis not present

## 2020-12-10 DIAGNOSIS — H353132 Nonexudative age-related macular degeneration, bilateral, intermediate dry stage: Secondary | ICD-10-CM | POA: Diagnosis not present

## 2020-12-11 DIAGNOSIS — M542 Cervicalgia: Secondary | ICD-10-CM | POA: Diagnosis not present

## 2020-12-13 DIAGNOSIS — D649 Anemia, unspecified: Secondary | ICD-10-CM | POA: Diagnosis not present

## 2020-12-13 DIAGNOSIS — E785 Hyperlipidemia, unspecified: Secondary | ICD-10-CM | POA: Diagnosis not present

## 2020-12-13 DIAGNOSIS — E538 Deficiency of other specified B group vitamins: Secondary | ICD-10-CM | POA: Diagnosis not present

## 2020-12-13 DIAGNOSIS — M542 Cervicalgia: Secondary | ICD-10-CM | POA: Diagnosis not present

## 2020-12-13 DIAGNOSIS — E1129 Type 2 diabetes mellitus with other diabetic kidney complication: Secondary | ICD-10-CM | POA: Diagnosis not present

## 2020-12-13 DIAGNOSIS — E611 Iron deficiency: Secondary | ICD-10-CM | POA: Diagnosis not present

## 2020-12-13 DIAGNOSIS — M25511 Pain in right shoulder: Secondary | ICD-10-CM | POA: Diagnosis not present

## 2020-12-13 DIAGNOSIS — R809 Proteinuria, unspecified: Secondary | ICD-10-CM | POA: Diagnosis not present

## 2020-12-13 DIAGNOSIS — M8580 Other specified disorders of bone density and structure, unspecified site: Secondary | ICD-10-CM | POA: Diagnosis not present

## 2020-12-13 DIAGNOSIS — I10 Essential (primary) hypertension: Secondary | ICD-10-CM | POA: Diagnosis not present

## 2021-01-01 ENCOUNTER — Ambulatory Visit: Payer: Self-pay

## 2021-01-01 ENCOUNTER — Encounter: Payer: Self-pay | Admitting: Family Medicine

## 2021-01-01 ENCOUNTER — Ambulatory Visit: Payer: Medicare PPO | Admitting: Family Medicine

## 2021-01-01 ENCOUNTER — Other Ambulatory Visit: Payer: Self-pay

## 2021-01-01 VITALS — BP 132/74 | Ht 64.0 in | Wt 125.0 lb

## 2021-01-01 DIAGNOSIS — M19019 Primary osteoarthritis, unspecified shoulder: Secondary | ICD-10-CM

## 2021-01-01 DIAGNOSIS — M25511 Pain in right shoulder: Secondary | ICD-10-CM

## 2021-01-01 DIAGNOSIS — M542 Cervicalgia: Secondary | ICD-10-CM | POA: Diagnosis not present

## 2021-01-01 MED ORDER — TRIAMCINOLONE ACETONIDE 40 MG/ML IJ SUSP
40.0000 mg | Freq: Once | INTRAMUSCULAR | Status: AC
  Administered 2021-01-01: 40 mg via INTRA_ARTICULAR

## 2021-01-01 NOTE — Patient Instructions (Signed)
Good to see you Please try ice as needed   Please send me a message in MyChart with any questions or updates.  Please see me back in 4 weeks.   --Dr. Rona Tomson  

## 2021-01-01 NOTE — Assessment & Plan Note (Signed)
Symptoms seem most consistent with AC joint.  Does have an effusion on exam today. -Counseled on home exercise therapy and supportive care. -Injection today. -Could consider physical therapy or shockwave for the rotator cuff.

## 2021-01-01 NOTE — Progress Notes (Signed)
  Jillian Simon - 75 y.o. female MRN [id number]  Date of birth: [birth year]  SUBJECTIVE:  Including CC & ROS.  No chief complaint on file.   Jillian Simon is a 75 y.o. female that is presenting with right shoulder pain.  The pain is acute on chronic in nature.  Did get improvement with previous AC joint injection.  Denies any injury in the interim.   Review of Systems See HPI   HISTORY: Past Medical, Surgical, Social, and Family History Reviewed & Updated per EMR.   Pertinent Historical Findings include:  Past Medical History:  Diagnosis Date  . Diabetes (HCC)   . Hypertension   . Insomnia   . Osteoporosis     History reviewed. No pertinent surgical history.  History reviewed. No pertinent family history.  Social History   Socioeconomic History  . Marital status: Married    Spouse name: Not on file  . Number of children: Not on file  . Years of education: Not on file  . Highest education level: Not on file  Occupational History  . Not on file  Tobacco Use  . Smoking status: Never Smoker  . Smokeless tobacco: Never Used  Substance and Sexual Activity  . Alcohol use: No  . Drug use: No  . Sexual activity: Not on file  Other Topics Concern  . Not on file  Social History Narrative  . Not on file   Social Determinants of Health   Financial Resource Strain: Not on file  Food Insecurity: Not on file  Transportation Needs: Not on file  Physical Activity: Not on file  Stress: Not on file  Social Connections: Not on file  Intimate Partner Violence: Not on file     PHYSICAL EXAM:  VS: BP 132/74 (BP Location: Left Arm, Patient Position: Sitting, Cuff Size: Normal)   Ht 5\' 4"  (1.626 m)   Wt 125 lb (56.7 kg)   BMI 21.46 kg/m  Physical Exam Gen: NAD, alert, cooperative with exam, well-appearing MSK:  Right shoulder: Normal range of motion. Normal strength resistance. Neurovascular intact  Limited ultrasound: Right shoulder:  Normal-appearing biceps tendon in long  and short axis. Normal subscapularis. Mild degenerative changes at the footprint of the supraspinatus with mild bursitis. No significant change in the posterior glenohumeral joint. AC joint with effusion appreciated  Summary: AC joint arthropathy  Ultrasound and interpretation by , MD   Aspiration/Injection Procedure Note Jillian Simon 1946/08/03  Procedure: Injection Indications: Right shoulder pain  Procedure Details Consent: Risks of procedure as well as the alternatives and risks of each were explained to the (patient/caregiver).  Consent for procedure obtained. Time Out: Verified patient identification, verified procedure, site/side was marked, verified correct patient position, special equipment/implants available, medications/allergies/relevent history reviewed, required imaging and test results available.  Performed.  The area was cleaned with iodine and alcohol swabs.    The right AC joint was injected using 1 cc's of 40 mg Kenalog and 1 cc's of 0.25% bupivacaine with a 25 1 1/2" needle.  Ultrasound was used. Images were obtained in short views showing the injection.     A sterile dressing was applied.  Patient did tolerate procedure well.    ASSESSMENT & PLAN:   AC joint arthropathy Symptoms seem most consistent with AC joint.  Does have an effusion on exam today. -Counseled on home exercise therapy and supportive care. -Injection today. -Could consider physical therapy or shockwave for the rotator cuff.

## 2021-01-09 DIAGNOSIS — H353132 Nonexudative age-related macular degeneration, bilateral, intermediate dry stage: Secondary | ICD-10-CM | POA: Diagnosis not present

## 2021-01-16 DIAGNOSIS — H40023 Open angle with borderline findings, high risk, bilateral: Secondary | ICD-10-CM | POA: Diagnosis not present

## 2021-01-22 DIAGNOSIS — M542 Cervicalgia: Secondary | ICD-10-CM | POA: Diagnosis not present

## 2021-02-08 DIAGNOSIS — H353132 Nonexudative age-related macular degeneration, bilateral, intermediate dry stage: Secondary | ICD-10-CM | POA: Diagnosis not present

## 2021-02-12 DIAGNOSIS — M542 Cervicalgia: Secondary | ICD-10-CM | POA: Diagnosis not present

## 2021-02-13 ENCOUNTER — Ambulatory Visit: Payer: Medicare PPO | Admitting: Family Medicine

## 2021-03-05 DIAGNOSIS — M542 Cervicalgia: Secondary | ICD-10-CM | POA: Diagnosis not present

## 2021-03-10 DIAGNOSIS — H353132 Nonexudative age-related macular degeneration, bilateral, intermediate dry stage: Secondary | ICD-10-CM | POA: Diagnosis not present

## 2021-03-17 ENCOUNTER — Ambulatory Visit: Payer: Self-pay

## 2021-03-17 ENCOUNTER — Other Ambulatory Visit: Payer: Self-pay

## 2021-03-17 ENCOUNTER — Ambulatory Visit: Payer: Medicare PPO | Admitting: Family Medicine

## 2021-03-17 VITALS — Ht 63.5 in | Wt 124.0 lb

## 2021-03-17 DIAGNOSIS — M25511 Pain in right shoulder: Secondary | ICD-10-CM

## 2021-03-17 DIAGNOSIS — M778 Other enthesopathies, not elsewhere classified: Secondary | ICD-10-CM | POA: Insufficient documentation

## 2021-03-17 MED ORDER — TRIAMCINOLONE ACETONIDE 40 MG/ML IJ SUSP
40.0000 mg | Freq: Once | INTRAMUSCULAR | Status: AC
  Administered 2021-03-17: 40 mg via INTRA_ARTICULAR

## 2021-03-17 NOTE — Assessment & Plan Note (Signed)
Symptoms more associated with the joint.  Has limitations in range of motion.  Seems more capsular in nature as opposed to degenerative. -Counseled on home exercise therapy and supportive care. -Has physical therapy scheduled to address as well. - Injection today. -Could consider imaging.

## 2021-03-17 NOTE — Patient Instructions (Signed)
Good to see you Please try heat before exercises and ice after  Please try the exercises   Please send me a message in MyChart with any questions or updates.  Please see me back in 4-6 weeks .   --Dr. Jordan Likes

## 2021-03-17 NOTE — Progress Notes (Signed)
  Brandee Markin - 75 y.o. female MRN [id number]  Date of birth: 1945/08/19  SUBJECTIVE:  Including CC & ROS.  No chief complaint on file.   Tiphani Mells is a 75 y.o. female that is presenting with acute worsening of her right shoulder pain.  The pain is more around the joint itself.  Did get resolution with the [facility] joint injection but the pain has returned..    Review of Systems See HPI   HISTORY: Past Medical, Surgical, Social, and Family History Reviewed & Updated per EMR.   Pertinent Historical Findings include:  Past Medical History:  Diagnosis Date   Diabetes (HCC)    Hypertension    Insomnia    Osteoporosis     No past surgical history on file.  No family history on file.  Social History   Socioeconomic History   Marital status: Married    Spouse name: Not on file   Number of children: Not on file   Years of education: Not on file   Highest education level: Not on file  Occupational History   Not on file  Tobacco Use   Smoking status: Never   Smokeless tobacco: Never  Substance and Sexual Activity   Alcohol use: No   Drug use: No   Sexual activity: Not on file  Other Topics Concern   Not on file  Social History Narrative   Not on file   Social Determinants of Health   Financial Resource Strain: Not on file  Food Insecurity: Not on file  Transportation Needs: Not on file  Physical Activity: Not on file  Stress: Not on file  Social Connections: Not on file  Intimate Partner Violence: Not on file     PHYSICAL EXAM:  VS: Ht 5' 3.5" (1.613 m)   Wt 124 lb (56.2 kg)   BMI 21.62 kg/m  Physical Exam Gen: NAD, alert, cooperative with exam, well-appearing MSK:  Right shoulder: Limited external rotation. Limited external rotation and abduction. Normal empty can test. Neurovascular intact   Aspiration/Injection Procedure Note Lavilla Delamora May 18, 1946  Procedure: Injection Indications: Right shoulder pain  Procedure Details Consent: Risks of  procedure as well as the alternatives and risks of each were explained to the (patient/caregiver).  Consent for procedure obtained. Time Out: Verified patient identification, verified procedure, site/side was marked, verified correct patient position, special equipment/implants available, medications/allergies/relevent history reviewed, required imaging and test results available.  Performed.  The area was cleaned with iodine and alcohol swabs.    The right glenohumeral joint was injected using 3 cc of 1% lidocaine on a 21-gauge 2 inch needle.  The syringe was switched and a mixture containing 1 cc's of 40 mg 3 and 3 cc's of 0.25% bupivacaine, and 3 cc of 1% lidocaine were injected. Marland Kitchen  Ultrasound was used. Images were obtained in short views showing the injection.     A sterile dressing was applied.  Patient did tolerate procedure well.     ASSESSMENT & PLAN:   Capsulitis of right shoulder Symptoms more associated with the joint.  Has limitations in range of motion.  Seems more capsular in nature as opposed to degenerative. -Counseled on home exercise therapy and supportive care. -Has physical therapy scheduled to address as well. - Injection today. -Could consider imaging.

## 2021-03-26 DIAGNOSIS — M542 Cervicalgia: Secondary | ICD-10-CM | POA: Diagnosis not present

## 2021-03-26 DIAGNOSIS — M25511 Pain in right shoulder: Secondary | ICD-10-CM | POA: Diagnosis not present

## 2021-04-09 DIAGNOSIS — H353132 Nonexudative age-related macular degeneration, bilateral, intermediate dry stage: Secondary | ICD-10-CM | POA: Diagnosis not present

## 2021-04-16 DIAGNOSIS — M542 Cervicalgia: Secondary | ICD-10-CM | POA: Diagnosis not present

## 2021-04-16 DIAGNOSIS — M25511 Pain in right shoulder: Secondary | ICD-10-CM | POA: Diagnosis not present

## 2021-05-07 DIAGNOSIS — M25511 Pain in right shoulder: Secondary | ICD-10-CM | POA: Diagnosis not present

## 2021-05-07 DIAGNOSIS — M542 Cervicalgia: Secondary | ICD-10-CM | POA: Diagnosis not present

## 2021-05-09 DIAGNOSIS — H353132 Nonexudative age-related macular degeneration, bilateral, intermediate dry stage: Secondary | ICD-10-CM | POA: Diagnosis not present

## 2021-05-22 DIAGNOSIS — L821 Other seborrheic keratosis: Secondary | ICD-10-CM | POA: Diagnosis not present

## 2021-05-22 DIAGNOSIS — Z85828 Personal history of other malignant neoplasm of skin: Secondary | ICD-10-CM | POA: Diagnosis not present

## 2021-05-26 ENCOUNTER — Encounter (INDEPENDENT_AMBULATORY_CARE_PROVIDER_SITE_OTHER): Payer: Medicare PPO | Admitting: Ophthalmology

## 2021-05-26 ENCOUNTER — Other Ambulatory Visit: Payer: Self-pay

## 2021-05-26 DIAGNOSIS — I10 Essential (primary) hypertension: Secondary | ICD-10-CM

## 2021-05-26 DIAGNOSIS — H35033 Hypertensive retinopathy, bilateral: Secondary | ICD-10-CM

## 2021-05-26 DIAGNOSIS — H353132 Nonexudative age-related macular degeneration, bilateral, intermediate dry stage: Secondary | ICD-10-CM | POA: Diagnosis not present

## 2021-05-26 DIAGNOSIS — H43813 Vitreous degeneration, bilateral: Secondary | ICD-10-CM

## 2021-05-28 DIAGNOSIS — M25511 Pain in right shoulder: Secondary | ICD-10-CM | POA: Diagnosis not present

## 2021-05-28 DIAGNOSIS — M542 Cervicalgia: Secondary | ICD-10-CM | POA: Diagnosis not present

## 2021-06-08 DIAGNOSIS — H353132 Nonexudative age-related macular degeneration, bilateral, intermediate dry stage: Secondary | ICD-10-CM | POA: Diagnosis not present

## 2021-06-11 DIAGNOSIS — M25511 Pain in right shoulder: Secondary | ICD-10-CM | POA: Diagnosis not present

## 2021-06-11 DIAGNOSIS — M542 Cervicalgia: Secondary | ICD-10-CM | POA: Diagnosis not present

## 2021-06-24 DIAGNOSIS — E611 Iron deficiency: Secondary | ICD-10-CM | POA: Diagnosis not present

## 2021-06-24 DIAGNOSIS — E785 Hyperlipidemia, unspecified: Secondary | ICD-10-CM | POA: Diagnosis not present

## 2021-06-24 DIAGNOSIS — M8589 Other specified disorders of bone density and structure, multiple sites: Secondary | ICD-10-CM | POA: Diagnosis not present

## 2021-06-24 DIAGNOSIS — E538 Deficiency of other specified B group vitamins: Secondary | ICD-10-CM | POA: Diagnosis not present

## 2021-06-24 DIAGNOSIS — D649 Anemia, unspecified: Secondary | ICD-10-CM | POA: Diagnosis not present

## 2021-06-24 DIAGNOSIS — E1129 Type 2 diabetes mellitus with other diabetic kidney complication: Secondary | ICD-10-CM | POA: Diagnosis not present

## 2021-06-24 DIAGNOSIS — M859 Disorder of bone density and structure, unspecified: Secondary | ICD-10-CM | POA: Diagnosis not present

## 2021-06-26 DIAGNOSIS — M25511 Pain in right shoulder: Secondary | ICD-10-CM | POA: Diagnosis not present

## 2021-06-26 DIAGNOSIS — M542 Cervicalgia: Secondary | ICD-10-CM | POA: Diagnosis not present

## 2021-07-01 DIAGNOSIS — I10 Essential (primary) hypertension: Secondary | ICD-10-CM | POA: Diagnosis not present

## 2021-07-01 DIAGNOSIS — M25511 Pain in right shoulder: Secondary | ICD-10-CM | POA: Diagnosis not present

## 2021-07-01 DIAGNOSIS — F5101 Primary insomnia: Secondary | ICD-10-CM | POA: Diagnosis not present

## 2021-07-01 DIAGNOSIS — Z1331 Encounter for screening for depression: Secondary | ICD-10-CM | POA: Diagnosis not present

## 2021-07-01 DIAGNOSIS — Z86010 Personal history of colonic polyps: Secondary | ICD-10-CM | POA: Diagnosis not present

## 2021-07-01 DIAGNOSIS — E785 Hyperlipidemia, unspecified: Secondary | ICD-10-CM | POA: Diagnosis not present

## 2021-07-01 DIAGNOSIS — Z1339 Encounter for screening examination for other mental health and behavioral disorders: Secondary | ICD-10-CM | POA: Diagnosis not present

## 2021-07-01 DIAGNOSIS — M8580 Other specified disorders of bone density and structure, unspecified site: Secondary | ICD-10-CM | POA: Diagnosis not present

## 2021-07-01 DIAGNOSIS — E1129 Type 2 diabetes mellitus with other diabetic kidney complication: Secondary | ICD-10-CM | POA: Diagnosis not present

## 2021-07-01 DIAGNOSIS — E611 Iron deficiency: Secondary | ICD-10-CM | POA: Diagnosis not present

## 2021-07-01 DIAGNOSIS — R82998 Other abnormal findings in urine: Secondary | ICD-10-CM | POA: Diagnosis not present

## 2021-07-01 DIAGNOSIS — Z0000 Encounter for general adult medical examination without abnormal findings: Secondary | ICD-10-CM | POA: Diagnosis not present

## 2021-07-08 DIAGNOSIS — H353132 Nonexudative age-related macular degeneration, bilateral, intermediate dry stage: Secondary | ICD-10-CM | POA: Diagnosis not present

## 2021-07-21 DIAGNOSIS — M542 Cervicalgia: Secondary | ICD-10-CM | POA: Diagnosis not present

## 2021-07-21 DIAGNOSIS — M25511 Pain in right shoulder: Secondary | ICD-10-CM | POA: Diagnosis not present

## 2021-08-07 DIAGNOSIS — H353132 Nonexudative age-related macular degeneration, bilateral, intermediate dry stage: Secondary | ICD-10-CM | POA: Diagnosis not present

## 2021-08-12 DIAGNOSIS — M542 Cervicalgia: Secondary | ICD-10-CM | POA: Diagnosis not present

## 2021-08-12 DIAGNOSIS — M25511 Pain in right shoulder: Secondary | ICD-10-CM | POA: Diagnosis not present

## 2021-09-04 DIAGNOSIS — M25511 Pain in right shoulder: Secondary | ICD-10-CM | POA: Diagnosis not present

## 2021-09-04 DIAGNOSIS — M542 Cervicalgia: Secondary | ICD-10-CM | POA: Diagnosis not present

## 2021-09-06 DIAGNOSIS — H353132 Nonexudative age-related macular degeneration, bilateral, intermediate dry stage: Secondary | ICD-10-CM | POA: Diagnosis not present

## 2021-09-22 DIAGNOSIS — M542 Cervicalgia: Secondary | ICD-10-CM | POA: Diagnosis not present

## 2021-09-22 DIAGNOSIS — M25511 Pain in right shoulder: Secondary | ICD-10-CM | POA: Diagnosis not present

## 2021-10-06 DIAGNOSIS — H353132 Nonexudative age-related macular degeneration, bilateral, intermediate dry stage: Secondary | ICD-10-CM | POA: Diagnosis not present

## 2021-10-15 DIAGNOSIS — M542 Cervicalgia: Secondary | ICD-10-CM | POA: Diagnosis not present

## 2021-10-15 DIAGNOSIS — M25511 Pain in right shoulder: Secondary | ICD-10-CM | POA: Diagnosis not present

## 2021-10-25 IMAGING — CR DG ABDOMEN 1V
2 series · 2 of 2 positions shown · non-contrast
Comparison: None.

CLINICAL DATA: 74-year-old female with capsule endoscopy 1 week
ago, query retained capsule.

EXAM:
ABDOMEN - 1 VIEW

[t abdomen supine (1 of 2)]
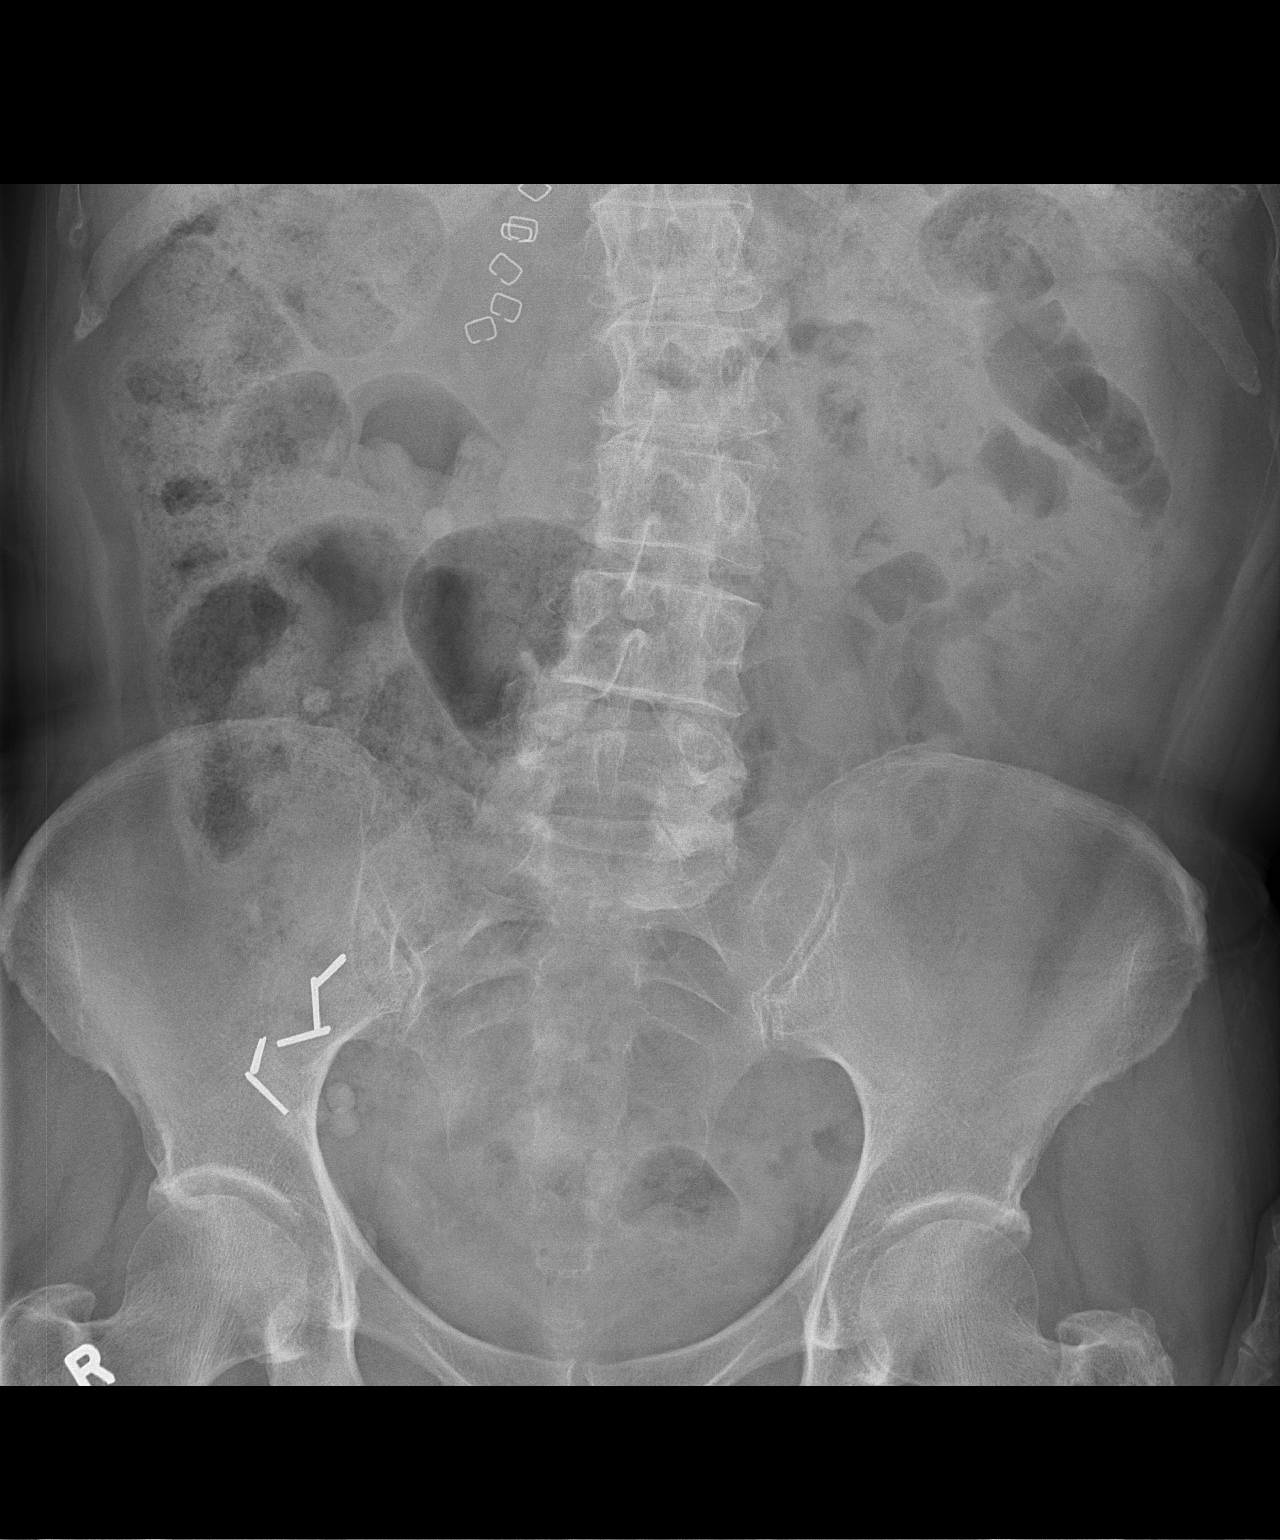

[t abdomen supine (2 of 2)]
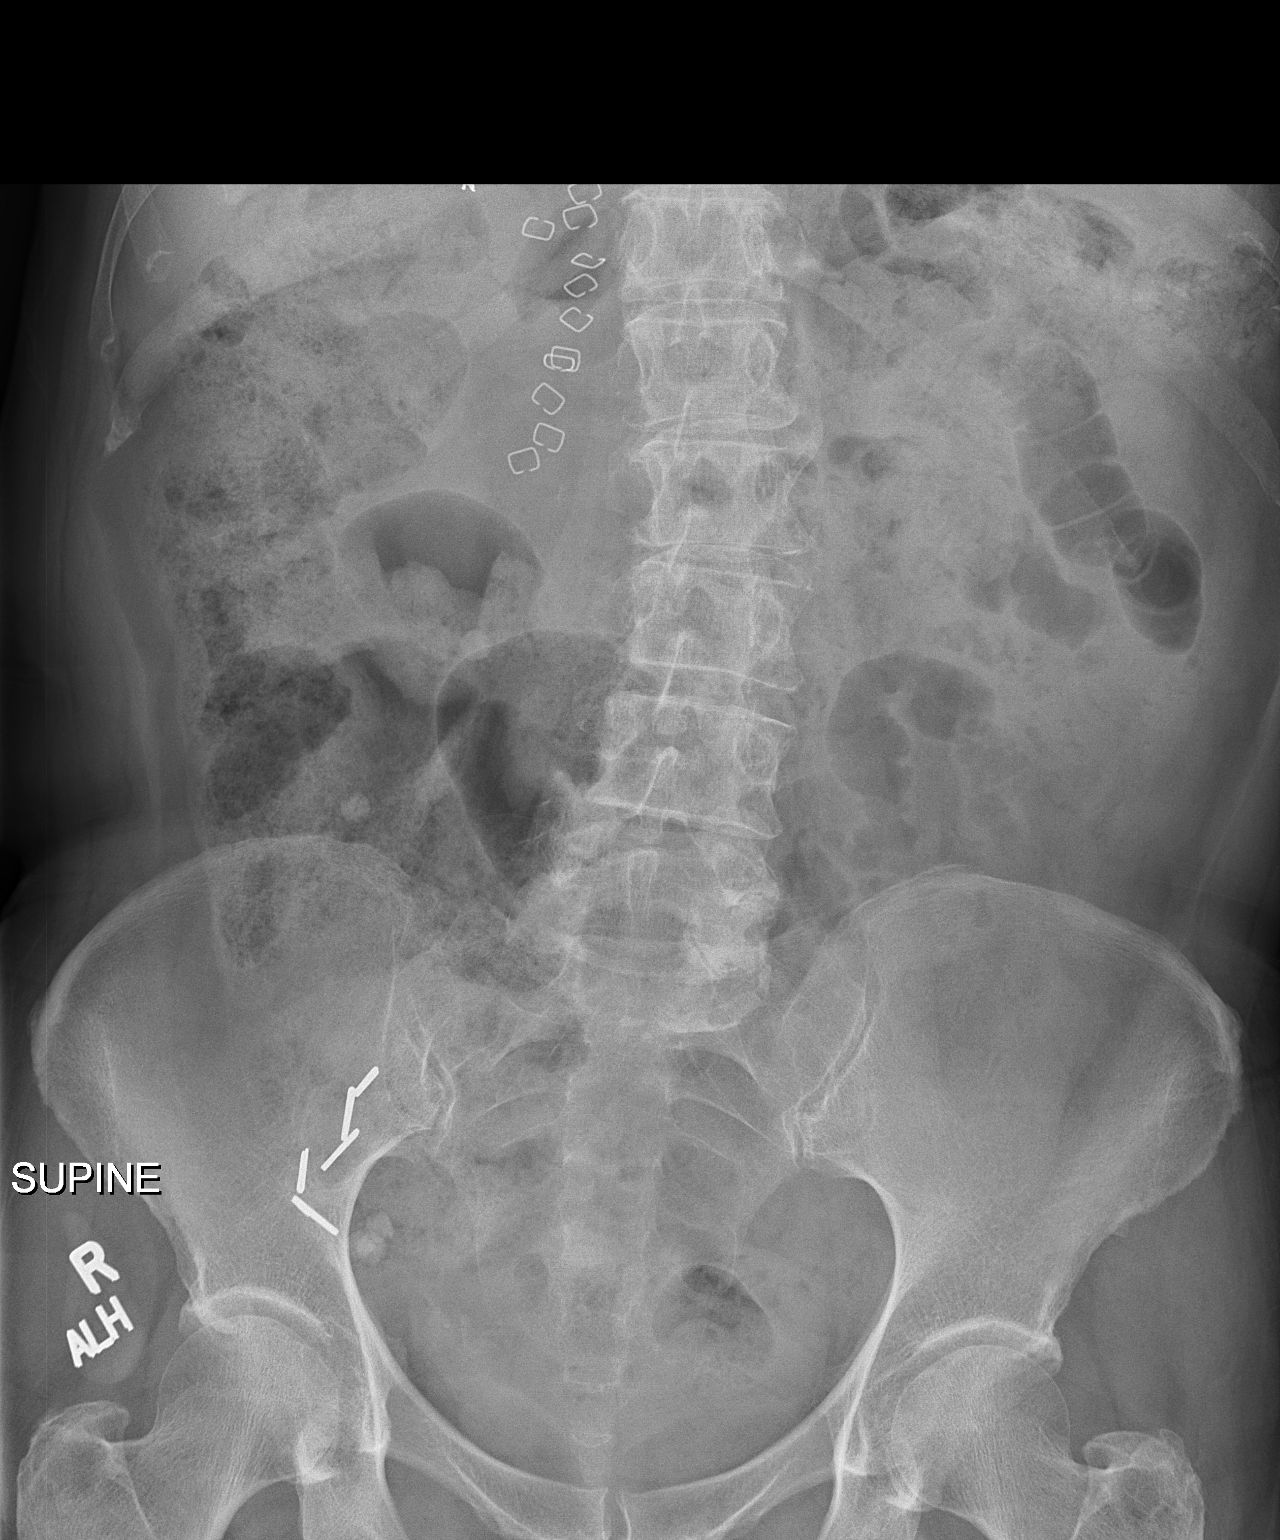

[2 of 2 positions shown; findings below may reference images not displayed]

FINDINGS: Two supine views of the abdomen and pelvis. Right upper quadrant
probable cholecystectomy clips. Multiple right pelvic clips.
Multiple right upper abdominal metallic staples.

No other radiopaque foreign body identified. Non obstructed bowel
gas pattern. Moderate volume retained stool in the large bowel.
Levoconvex lumbar scoliosis with intermittent spinal disc and
endplate degeneration. No acute osseous abnormality identified.
IMPRESSION: 1. Negative for retained endoscopy capsule.
2. Postoperative changes to the right abdomen and pelvis. Non
obstructed bowel gas pattern.

## 2021-11-05 DIAGNOSIS — H353132 Nonexudative age-related macular degeneration, bilateral, intermediate dry stage: Secondary | ICD-10-CM | POA: Diagnosis not present

## 2021-11-06 DIAGNOSIS — M25511 Pain in right shoulder: Secondary | ICD-10-CM | POA: Diagnosis not present

## 2021-11-06 DIAGNOSIS — M542 Cervicalgia: Secondary | ICD-10-CM | POA: Diagnosis not present

## 2021-11-24 DIAGNOSIS — M25511 Pain in right shoulder: Secondary | ICD-10-CM | POA: Diagnosis not present

## 2021-11-24 DIAGNOSIS — M542 Cervicalgia: Secondary | ICD-10-CM | POA: Diagnosis not present

## 2021-12-01 ENCOUNTER — Encounter (INDEPENDENT_AMBULATORY_CARE_PROVIDER_SITE_OTHER): Payer: Medicare PPO | Admitting: Ophthalmology

## 2021-12-05 DIAGNOSIS — H353132 Nonexudative age-related macular degeneration, bilateral, intermediate dry stage: Secondary | ICD-10-CM | POA: Diagnosis not present

## 2021-12-10 DIAGNOSIS — Z1231 Encounter for screening mammogram for malignant neoplasm of breast: Secondary | ICD-10-CM | POA: Diagnosis not present

## 2021-12-10 DIAGNOSIS — Z01419 Encounter for gynecological examination (general) (routine) without abnormal findings: Secondary | ICD-10-CM | POA: Diagnosis not present

## 2021-12-10 DIAGNOSIS — Z6822 Body mass index (BMI) 22.0-22.9, adult: Secondary | ICD-10-CM | POA: Diagnosis not present

## 2021-12-16 DIAGNOSIS — M542 Cervicalgia: Secondary | ICD-10-CM | POA: Diagnosis not present

## 2021-12-16 DIAGNOSIS — M25511 Pain in right shoulder: Secondary | ICD-10-CM | POA: Diagnosis not present

## 2021-12-22 ENCOUNTER — Encounter (INDEPENDENT_AMBULATORY_CARE_PROVIDER_SITE_OTHER): Payer: Medicare PPO | Admitting: Ophthalmology

## 2021-12-22 DIAGNOSIS — H43813 Vitreous degeneration, bilateral: Secondary | ICD-10-CM | POA: Diagnosis not present

## 2021-12-22 DIAGNOSIS — H353132 Nonexudative age-related macular degeneration, bilateral, intermediate dry stage: Secondary | ICD-10-CM

## 2021-12-22 DIAGNOSIS — I10 Essential (primary) hypertension: Secondary | ICD-10-CM | POA: Diagnosis not present

## 2021-12-22 DIAGNOSIS — H35033 Hypertensive retinopathy, bilateral: Secondary | ICD-10-CM

## 2021-12-29 DIAGNOSIS — I10 Essential (primary) hypertension: Secondary | ICD-10-CM | POA: Diagnosis not present

## 2021-12-29 DIAGNOSIS — E1129 Type 2 diabetes mellitus with other diabetic kidney complication: Secondary | ICD-10-CM | POA: Diagnosis not present

## 2021-12-29 DIAGNOSIS — E785 Hyperlipidemia, unspecified: Secondary | ICD-10-CM | POA: Diagnosis not present

## 2021-12-29 DIAGNOSIS — M8580 Other specified disorders of bone density and structure, unspecified site: Secondary | ICD-10-CM | POA: Diagnosis not present

## 2021-12-29 DIAGNOSIS — R809 Proteinuria, unspecified: Secondary | ICD-10-CM | POA: Diagnosis not present

## 2021-12-29 DIAGNOSIS — M542 Cervicalgia: Secondary | ICD-10-CM | POA: Diagnosis not present

## 2021-12-29 DIAGNOSIS — E611 Iron deficiency: Secondary | ICD-10-CM | POA: Diagnosis not present

## 2021-12-29 DIAGNOSIS — K581 Irritable bowel syndrome with constipation: Secondary | ICD-10-CM | POA: Diagnosis not present

## 2021-12-29 DIAGNOSIS — D649 Anemia, unspecified: Secondary | ICD-10-CM | POA: Diagnosis not present

## 2022-01-04 DIAGNOSIS — H353132 Nonexudative age-related macular degeneration, bilateral, intermediate dry stage: Secondary | ICD-10-CM | POA: Diagnosis not present

## 2022-01-06 ENCOUNTER — Encounter (INDEPENDENT_AMBULATORY_CARE_PROVIDER_SITE_OTHER): Payer: Medicare PPO | Admitting: Ophthalmology

## 2022-01-06 DIAGNOSIS — I10 Essential (primary) hypertension: Secondary | ICD-10-CM | POA: Diagnosis not present

## 2022-01-06 DIAGNOSIS — H35033 Hypertensive retinopathy, bilateral: Secondary | ICD-10-CM

## 2022-01-06 DIAGNOSIS — H353132 Nonexudative age-related macular degeneration, bilateral, intermediate dry stage: Secondary | ICD-10-CM

## 2022-01-06 DIAGNOSIS — H43813 Vitreous degeneration, bilateral: Secondary | ICD-10-CM | POA: Diagnosis not present

## 2022-01-06 DIAGNOSIS — M542 Cervicalgia: Secondary | ICD-10-CM | POA: Diagnosis not present

## 2022-01-06 DIAGNOSIS — M25511 Pain in right shoulder: Secondary | ICD-10-CM | POA: Diagnosis not present

## 2022-01-22 DIAGNOSIS — M25511 Pain in right shoulder: Secondary | ICD-10-CM | POA: Diagnosis not present

## 2022-01-22 DIAGNOSIS — M542 Cervicalgia: Secondary | ICD-10-CM | POA: Diagnosis not present

## 2022-02-03 DIAGNOSIS — H353132 Nonexudative age-related macular degeneration, bilateral, intermediate dry stage: Secondary | ICD-10-CM | POA: Diagnosis not present

## 2022-02-04 DIAGNOSIS — H40023 Open angle with borderline findings, high risk, bilateral: Secondary | ICD-10-CM | POA: Diagnosis not present

## 2022-02-04 DIAGNOSIS — E119 Type 2 diabetes mellitus without complications: Secondary | ICD-10-CM | POA: Diagnosis not present

## 2022-02-04 DIAGNOSIS — H3554 Dystrophies primarily involving the retinal pigment epithelium: Secondary | ICD-10-CM | POA: Diagnosis not present

## 2022-02-04 DIAGNOSIS — H353132 Nonexudative age-related macular degeneration, bilateral, intermediate dry stage: Secondary | ICD-10-CM | POA: Diagnosis not present

## 2022-02-11 DIAGNOSIS — M542 Cervicalgia: Secondary | ICD-10-CM | POA: Diagnosis not present

## 2022-02-11 DIAGNOSIS — M25511 Pain in right shoulder: Secondary | ICD-10-CM | POA: Diagnosis not present

## 2022-02-26 DIAGNOSIS — M542 Cervicalgia: Secondary | ICD-10-CM | POA: Diagnosis not present

## 2022-02-26 DIAGNOSIS — M25511 Pain in right shoulder: Secondary | ICD-10-CM | POA: Diagnosis not present

## 2022-03-05 DIAGNOSIS — H353132 Nonexudative age-related macular degeneration, bilateral, intermediate dry stage: Secondary | ICD-10-CM | POA: Diagnosis not present

## 2022-03-19 DIAGNOSIS — M542 Cervicalgia: Secondary | ICD-10-CM | POA: Diagnosis not present

## 2022-03-19 DIAGNOSIS — M25511 Pain in right shoulder: Secondary | ICD-10-CM | POA: Diagnosis not present

## 2022-04-04 DIAGNOSIS — H353132 Nonexudative age-related macular degeneration, bilateral, intermediate dry stage: Secondary | ICD-10-CM | POA: Diagnosis not present

## 2022-04-09 DIAGNOSIS — M25511 Pain in right shoulder: Secondary | ICD-10-CM | POA: Diagnosis not present

## 2022-04-09 DIAGNOSIS — M542 Cervicalgia: Secondary | ICD-10-CM | POA: Diagnosis not present

## 2022-04-15 ENCOUNTER — Encounter (INDEPENDENT_AMBULATORY_CARE_PROVIDER_SITE_OTHER): Payer: Medicare PPO | Admitting: Ophthalmology

## 2022-04-15 DIAGNOSIS — H353132 Nonexudative age-related macular degeneration, bilateral, intermediate dry stage: Secondary | ICD-10-CM | POA: Diagnosis not present

## 2022-04-15 DIAGNOSIS — I10 Essential (primary) hypertension: Secondary | ICD-10-CM | POA: Diagnosis not present

## 2022-04-15 DIAGNOSIS — H43813 Vitreous degeneration, bilateral: Secondary | ICD-10-CM

## 2022-04-15 DIAGNOSIS — H35033 Hypertensive retinopathy, bilateral: Secondary | ICD-10-CM

## 2022-04-30 DIAGNOSIS — M542 Cervicalgia: Secondary | ICD-10-CM | POA: Diagnosis not present

## 2022-04-30 DIAGNOSIS — M25511 Pain in right shoulder: Secondary | ICD-10-CM | POA: Diagnosis not present

## 2022-05-04 DIAGNOSIS — H353132 Nonexudative age-related macular degeneration, bilateral, intermediate dry stage: Secondary | ICD-10-CM | POA: Diagnosis not present

## 2022-05-20 DIAGNOSIS — M25511 Pain in right shoulder: Secondary | ICD-10-CM | POA: Diagnosis not present

## 2022-05-20 DIAGNOSIS — M542 Cervicalgia: Secondary | ICD-10-CM | POA: Diagnosis not present

## 2022-05-21 DIAGNOSIS — Z85828 Personal history of other malignant neoplasm of skin: Secondary | ICD-10-CM | POA: Diagnosis not present

## 2022-05-21 DIAGNOSIS — L821 Other seborrheic keratosis: Secondary | ICD-10-CM | POA: Diagnosis not present

## 2022-06-03 DIAGNOSIS — H353132 Nonexudative age-related macular degeneration, bilateral, intermediate dry stage: Secondary | ICD-10-CM | POA: Diagnosis not present

## 2022-06-09 DIAGNOSIS — M25511 Pain in right shoulder: Secondary | ICD-10-CM | POA: Diagnosis not present

## 2022-06-09 DIAGNOSIS — M542 Cervicalgia: Secondary | ICD-10-CM | POA: Diagnosis not present

## 2022-06-30 DIAGNOSIS — M25511 Pain in right shoulder: Secondary | ICD-10-CM | POA: Diagnosis not present

## 2022-06-30 DIAGNOSIS — M542 Cervicalgia: Secondary | ICD-10-CM | POA: Diagnosis not present

## 2022-07-03 DIAGNOSIS — H353132 Nonexudative age-related macular degeneration, bilateral, intermediate dry stage: Secondary | ICD-10-CM | POA: Diagnosis not present

## 2022-07-14 ENCOUNTER — Encounter (INDEPENDENT_AMBULATORY_CARE_PROVIDER_SITE_OTHER): Payer: Medicare PPO | Admitting: Ophthalmology

## 2022-07-14 DIAGNOSIS — H35033 Hypertensive retinopathy, bilateral: Secondary | ICD-10-CM | POA: Diagnosis not present

## 2022-07-14 DIAGNOSIS — H43813 Vitreous degeneration, bilateral: Secondary | ICD-10-CM | POA: Diagnosis not present

## 2022-07-14 DIAGNOSIS — H353132 Nonexudative age-related macular degeneration, bilateral, intermediate dry stage: Secondary | ICD-10-CM | POA: Diagnosis not present

## 2022-07-14 DIAGNOSIS — I10 Essential (primary) hypertension: Secondary | ICD-10-CM | POA: Diagnosis not present

## 2022-07-22 DIAGNOSIS — M542 Cervicalgia: Secondary | ICD-10-CM | POA: Diagnosis not present

## 2022-07-22 DIAGNOSIS — M25511 Pain in right shoulder: Secondary | ICD-10-CM | POA: Diagnosis not present

## 2022-07-23 DIAGNOSIS — E1129 Type 2 diabetes mellitus with other diabetic kidney complication: Secondary | ICD-10-CM | POA: Diagnosis not present

## 2022-07-23 DIAGNOSIS — M8580 Other specified disorders of bone density and structure, unspecified site: Secondary | ICD-10-CM | POA: Diagnosis not present

## 2022-07-23 DIAGNOSIS — I10 Essential (primary) hypertension: Secondary | ICD-10-CM | POA: Diagnosis not present

## 2022-07-23 DIAGNOSIS — E612 Magnesium deficiency: Secondary | ICD-10-CM | POA: Diagnosis not present

## 2022-07-23 DIAGNOSIS — E785 Hyperlipidemia, unspecified: Secondary | ICD-10-CM | POA: Diagnosis not present

## 2022-07-23 DIAGNOSIS — E538 Deficiency of other specified B group vitamins: Secondary | ICD-10-CM | POA: Diagnosis not present

## 2022-07-23 DIAGNOSIS — R7989 Other specified abnormal findings of blood chemistry: Secondary | ICD-10-CM | POA: Diagnosis not present

## 2022-07-23 DIAGNOSIS — D649 Anemia, unspecified: Secondary | ICD-10-CM | POA: Diagnosis not present

## 2022-07-30 DIAGNOSIS — M8580 Other specified disorders of bone density and structure, unspecified site: Secondary | ICD-10-CM | POA: Diagnosis not present

## 2022-07-30 DIAGNOSIS — E1129 Type 2 diabetes mellitus with other diabetic kidney complication: Secondary | ICD-10-CM | POA: Diagnosis not present

## 2022-07-30 DIAGNOSIS — D649 Anemia, unspecified: Secondary | ICD-10-CM | POA: Diagnosis not present

## 2022-07-30 DIAGNOSIS — Z1339 Encounter for screening examination for other mental health and behavioral disorders: Secondary | ICD-10-CM | POA: Diagnosis not present

## 2022-07-30 DIAGNOSIS — E538 Deficiency of other specified B group vitamins: Secondary | ICD-10-CM | POA: Diagnosis not present

## 2022-07-30 DIAGNOSIS — Z1331 Encounter for screening for depression: Secondary | ICD-10-CM | POA: Diagnosis not present

## 2022-07-30 DIAGNOSIS — R82998 Other abnormal findings in urine: Secondary | ICD-10-CM | POA: Diagnosis not present

## 2022-07-30 DIAGNOSIS — E785 Hyperlipidemia, unspecified: Secondary | ICD-10-CM | POA: Diagnosis not present

## 2022-07-30 DIAGNOSIS — E611 Iron deficiency: Secondary | ICD-10-CM | POA: Diagnosis not present

## 2022-07-30 DIAGNOSIS — L853 Xerosis cutis: Secondary | ICD-10-CM | POA: Diagnosis not present

## 2022-07-30 DIAGNOSIS — Z0000 Encounter for general adult medical examination without abnormal findings: Secondary | ICD-10-CM | POA: Diagnosis not present

## 2022-07-30 DIAGNOSIS — I10 Essential (primary) hypertension: Secondary | ICD-10-CM | POA: Diagnosis not present

## 2022-08-02 DIAGNOSIS — H353132 Nonexudative age-related macular degeneration, bilateral, intermediate dry stage: Secondary | ICD-10-CM | POA: Diagnosis not present

## 2022-08-12 DIAGNOSIS — M25511 Pain in right shoulder: Secondary | ICD-10-CM | POA: Diagnosis not present

## 2022-08-12 DIAGNOSIS — M542 Cervicalgia: Secondary | ICD-10-CM | POA: Diagnosis not present

## 2022-09-01 DIAGNOSIS — M542 Cervicalgia: Secondary | ICD-10-CM | POA: Diagnosis not present

## 2022-09-01 DIAGNOSIS — M25511 Pain in right shoulder: Secondary | ICD-10-CM | POA: Diagnosis not present

## 2022-09-01 DIAGNOSIS — H353132 Nonexudative age-related macular degeneration, bilateral, intermediate dry stage: Secondary | ICD-10-CM | POA: Diagnosis not present

## 2022-09-22 DIAGNOSIS — M542 Cervicalgia: Secondary | ICD-10-CM | POA: Diagnosis not present

## 2022-09-22 DIAGNOSIS — M25511 Pain in right shoulder: Secondary | ICD-10-CM | POA: Diagnosis not present

## 2022-09-24 ENCOUNTER — Encounter (INDEPENDENT_AMBULATORY_CARE_PROVIDER_SITE_OTHER): Payer: Medicare PPO | Admitting: Ophthalmology

## 2022-10-01 DIAGNOSIS — H353112 Nonexudative age-related macular degeneration, right eye, intermediate dry stage: Secondary | ICD-10-CM | POA: Diagnosis not present

## 2022-10-13 DIAGNOSIS — M542 Cervicalgia: Secondary | ICD-10-CM | POA: Diagnosis not present

## 2022-10-13 DIAGNOSIS — M25511 Pain in right shoulder: Secondary | ICD-10-CM | POA: Diagnosis not present

## 2022-10-31 DIAGNOSIS — H353112 Nonexudative age-related macular degeneration, right eye, intermediate dry stage: Secondary | ICD-10-CM | POA: Diagnosis not present

## 2022-11-03 DIAGNOSIS — M542 Cervicalgia: Secondary | ICD-10-CM | POA: Diagnosis not present

## 2022-11-03 DIAGNOSIS — M25511 Pain in right shoulder: Secondary | ICD-10-CM | POA: Diagnosis not present

## 2022-11-03 DIAGNOSIS — M7662 Achilles tendinitis, left leg: Secondary | ICD-10-CM | POA: Diagnosis not present

## 2022-11-04 DIAGNOSIS — L820 Inflamed seborrheic keratosis: Secondary | ICD-10-CM | POA: Diagnosis not present

## 2022-11-04 DIAGNOSIS — L821 Other seborrheic keratosis: Secondary | ICD-10-CM | POA: Diagnosis not present

## 2022-11-04 DIAGNOSIS — L219 Seborrheic dermatitis, unspecified: Secondary | ICD-10-CM | POA: Diagnosis not present

## 2022-11-10 ENCOUNTER — Encounter (INDEPENDENT_AMBULATORY_CARE_PROVIDER_SITE_OTHER): Payer: Medicare PPO | Admitting: Ophthalmology

## 2022-11-10 DIAGNOSIS — H353122 Nonexudative age-related macular degeneration, left eye, intermediate dry stage: Secondary | ICD-10-CM

## 2022-11-10 DIAGNOSIS — H43813 Vitreous degeneration, bilateral: Secondary | ICD-10-CM | POA: Diagnosis not present

## 2022-11-10 DIAGNOSIS — H35033 Hypertensive retinopathy, bilateral: Secondary | ICD-10-CM | POA: Diagnosis not present

## 2022-11-10 DIAGNOSIS — I10 Essential (primary) hypertension: Secondary | ICD-10-CM

## 2022-11-10 DIAGNOSIS — H353211 Exudative age-related macular degeneration, right eye, with active choroidal neovascularization: Secondary | ICD-10-CM | POA: Diagnosis not present

## 2022-11-26 DIAGNOSIS — M25511 Pain in right shoulder: Secondary | ICD-10-CM | POA: Diagnosis not present

## 2022-11-26 DIAGNOSIS — M7662 Achilles tendinitis, left leg: Secondary | ICD-10-CM | POA: Diagnosis not present

## 2022-11-26 DIAGNOSIS — M542 Cervicalgia: Secondary | ICD-10-CM | POA: Diagnosis not present

## 2022-11-30 ENCOUNTER — Encounter: Payer: Self-pay | Admitting: *Deleted

## 2022-11-30 DIAGNOSIS — H353112 Nonexudative age-related macular degeneration, right eye, intermediate dry stage: Secondary | ICD-10-CM | POA: Diagnosis not present

## 2022-12-15 DIAGNOSIS — Z6822 Body mass index (BMI) 22.0-22.9, adult: Secondary | ICD-10-CM | POA: Diagnosis not present

## 2022-12-15 DIAGNOSIS — Z01419 Encounter for gynecological examination (general) (routine) without abnormal findings: Secondary | ICD-10-CM | POA: Diagnosis not present

## 2022-12-15 DIAGNOSIS — Z1231 Encounter for screening mammogram for malignant neoplasm of breast: Secondary | ICD-10-CM | POA: Diagnosis not present

## 2022-12-15 DIAGNOSIS — N952 Postmenopausal atrophic vaginitis: Secondary | ICD-10-CM | POA: Diagnosis not present

## 2022-12-21 DIAGNOSIS — M7662 Achilles tendinitis, left leg: Secondary | ICD-10-CM | POA: Diagnosis not present

## 2022-12-21 DIAGNOSIS — M542 Cervicalgia: Secondary | ICD-10-CM | POA: Diagnosis not present

## 2022-12-21 DIAGNOSIS — M25511 Pain in right shoulder: Secondary | ICD-10-CM | POA: Diagnosis not present

## 2022-12-30 DIAGNOSIS — H353112 Nonexudative age-related macular degeneration, right eye, intermediate dry stage: Secondary | ICD-10-CM | POA: Diagnosis not present

## 2023-01-04 DIAGNOSIS — M25511 Pain in right shoulder: Secondary | ICD-10-CM | POA: Diagnosis not present

## 2023-01-04 DIAGNOSIS — M7662 Achilles tendinitis, left leg: Secondary | ICD-10-CM | POA: Diagnosis not present

## 2023-01-04 DIAGNOSIS — M542 Cervicalgia: Secondary | ICD-10-CM | POA: Diagnosis not present

## 2023-01-18 ENCOUNTER — Encounter (INDEPENDENT_AMBULATORY_CARE_PROVIDER_SITE_OTHER): Payer: Medicare PPO | Admitting: Ophthalmology

## 2023-01-18 DIAGNOSIS — H35033 Hypertensive retinopathy, bilateral: Secondary | ICD-10-CM | POA: Diagnosis not present

## 2023-01-18 DIAGNOSIS — I10 Essential (primary) hypertension: Secondary | ICD-10-CM | POA: Diagnosis not present

## 2023-01-18 DIAGNOSIS — H43813 Vitreous degeneration, bilateral: Secondary | ICD-10-CM

## 2023-01-18 DIAGNOSIS — H353132 Nonexudative age-related macular degeneration, bilateral, intermediate dry stage: Secondary | ICD-10-CM

## 2023-01-19 DIAGNOSIS — M25511 Pain in right shoulder: Secondary | ICD-10-CM | POA: Diagnosis not present

## 2023-01-19 DIAGNOSIS — M542 Cervicalgia: Secondary | ICD-10-CM | POA: Diagnosis not present

## 2023-01-19 DIAGNOSIS — M7662 Achilles tendinitis, left leg: Secondary | ICD-10-CM | POA: Diagnosis not present

## 2023-01-29 DIAGNOSIS — H353112 Nonexudative age-related macular degeneration, right eye, intermediate dry stage: Secondary | ICD-10-CM | POA: Diagnosis not present

## 2023-02-02 DIAGNOSIS — M8580 Other specified disorders of bone density and structure, unspecified site: Secondary | ICD-10-CM | POA: Diagnosis not present

## 2023-02-02 DIAGNOSIS — H409 Unspecified glaucoma: Secondary | ICD-10-CM | POA: Diagnosis not present

## 2023-02-02 DIAGNOSIS — E785 Hyperlipidemia, unspecified: Secondary | ICD-10-CM | POA: Diagnosis not present

## 2023-02-02 DIAGNOSIS — D649 Anemia, unspecified: Secondary | ICD-10-CM | POA: Diagnosis not present

## 2023-02-02 DIAGNOSIS — H3530 Unspecified macular degeneration: Secondary | ICD-10-CM | POA: Diagnosis not present

## 2023-02-02 DIAGNOSIS — E1129 Type 2 diabetes mellitus with other diabetic kidney complication: Secondary | ICD-10-CM | POA: Diagnosis not present

## 2023-02-02 DIAGNOSIS — E611 Iron deficiency: Secondary | ICD-10-CM | POA: Diagnosis not present

## 2023-02-02 DIAGNOSIS — I10 Essential (primary) hypertension: Secondary | ICD-10-CM | POA: Diagnosis not present

## 2023-02-02 DIAGNOSIS — K581 Irritable bowel syndrome with constipation: Secondary | ICD-10-CM | POA: Diagnosis not present

## 2023-02-08 DIAGNOSIS — H40023 Open angle with borderline findings, high risk, bilateral: Secondary | ICD-10-CM | POA: Diagnosis not present

## 2023-02-08 DIAGNOSIS — E119 Type 2 diabetes mellitus without complications: Secondary | ICD-10-CM | POA: Diagnosis not present

## 2023-02-08 DIAGNOSIS — H353132 Nonexudative age-related macular degeneration, bilateral, intermediate dry stage: Secondary | ICD-10-CM | POA: Diagnosis not present

## 2023-02-08 DIAGNOSIS — H3554 Dystrophies primarily involving the retinal pigment epithelium: Secondary | ICD-10-CM | POA: Diagnosis not present

## 2023-02-10 DIAGNOSIS — M25511 Pain in right shoulder: Secondary | ICD-10-CM | POA: Diagnosis not present

## 2023-02-10 DIAGNOSIS — M542 Cervicalgia: Secondary | ICD-10-CM | POA: Diagnosis not present

## 2023-02-10 DIAGNOSIS — M7662 Achilles tendinitis, left leg: Secondary | ICD-10-CM | POA: Diagnosis not present

## 2023-02-28 DIAGNOSIS — H353112 Nonexudative age-related macular degeneration, right eye, intermediate dry stage: Secondary | ICD-10-CM | POA: Diagnosis not present

## 2023-03-08 DIAGNOSIS — M542 Cervicalgia: Secondary | ICD-10-CM | POA: Diagnosis not present

## 2023-03-08 DIAGNOSIS — M7662 Achilles tendinitis, left leg: Secondary | ICD-10-CM | POA: Diagnosis not present

## 2023-03-08 DIAGNOSIS — M25511 Pain in right shoulder: Secondary | ICD-10-CM | POA: Diagnosis not present

## 2023-03-29 DIAGNOSIS — M542 Cervicalgia: Secondary | ICD-10-CM | POA: Diagnosis not present

## 2023-03-29 DIAGNOSIS — M7662 Achilles tendinitis, left leg: Secondary | ICD-10-CM | POA: Diagnosis not present

## 2023-03-29 DIAGNOSIS — M25511 Pain in right shoulder: Secondary | ICD-10-CM | POA: Diagnosis not present

## 2023-03-30 DIAGNOSIS — H353112 Nonexudative age-related macular degeneration, right eye, intermediate dry stage: Secondary | ICD-10-CM | POA: Diagnosis not present

## 2023-04-22 DIAGNOSIS — M25511 Pain in right shoulder: Secondary | ICD-10-CM | POA: Diagnosis not present

## 2023-04-22 DIAGNOSIS — M542 Cervicalgia: Secondary | ICD-10-CM | POA: Diagnosis not present

## 2023-04-22 DIAGNOSIS — M7662 Achilles tendinitis, left leg: Secondary | ICD-10-CM | POA: Diagnosis not present

## 2023-04-29 DIAGNOSIS — H353112 Nonexudative age-related macular degeneration, right eye, intermediate dry stage: Secondary | ICD-10-CM | POA: Diagnosis not present

## 2023-05-12 DIAGNOSIS — M25511 Pain in right shoulder: Secondary | ICD-10-CM | POA: Diagnosis not present

## 2023-05-12 DIAGNOSIS — M7662 Achilles tendinitis, left leg: Secondary | ICD-10-CM | POA: Diagnosis not present

## 2023-05-12 DIAGNOSIS — M542 Cervicalgia: Secondary | ICD-10-CM | POA: Diagnosis not present

## 2023-05-19 DIAGNOSIS — H40023 Open angle with borderline findings, high risk, bilateral: Secondary | ICD-10-CM | POA: Diagnosis not present

## 2023-05-29 DIAGNOSIS — H353112 Nonexudative age-related macular degeneration, right eye, intermediate dry stage: Secondary | ICD-10-CM | POA: Diagnosis not present

## 2023-06-01 DIAGNOSIS — H40021 Open angle with borderline findings, high risk, right eye: Secondary | ICD-10-CM | POA: Diagnosis not present

## 2023-06-01 DIAGNOSIS — M542 Cervicalgia: Secondary | ICD-10-CM | POA: Diagnosis not present

## 2023-06-01 DIAGNOSIS — M7662 Achilles tendinitis, left leg: Secondary | ICD-10-CM | POA: Diagnosis not present

## 2023-06-01 DIAGNOSIS — M25511 Pain in right shoulder: Secondary | ICD-10-CM | POA: Diagnosis not present

## 2023-06-03 DIAGNOSIS — L219 Seborrheic dermatitis, unspecified: Secondary | ICD-10-CM | POA: Diagnosis not present

## 2023-06-03 DIAGNOSIS — L820 Inflamed seborrheic keratosis: Secondary | ICD-10-CM | POA: Diagnosis not present

## 2023-06-03 DIAGNOSIS — Z85828 Personal history of other malignant neoplasm of skin: Secondary | ICD-10-CM | POA: Diagnosis not present

## 2023-06-15 DIAGNOSIS — H40022 Open angle with borderline findings, high risk, left eye: Secondary | ICD-10-CM | POA: Diagnosis not present

## 2023-06-16 DIAGNOSIS — M7662 Achilles tendinitis, left leg: Secondary | ICD-10-CM | POA: Diagnosis not present

## 2023-06-16 DIAGNOSIS — M542 Cervicalgia: Secondary | ICD-10-CM | POA: Diagnosis not present

## 2023-06-16 DIAGNOSIS — M25511 Pain in right shoulder: Secondary | ICD-10-CM | POA: Diagnosis not present

## 2023-06-28 DIAGNOSIS — H353112 Nonexudative age-related macular degeneration, right eye, intermediate dry stage: Secondary | ICD-10-CM | POA: Diagnosis not present

## 2023-07-08 DIAGNOSIS — M25511 Pain in right shoulder: Secondary | ICD-10-CM | POA: Diagnosis not present

## 2023-07-08 DIAGNOSIS — M7662 Achilles tendinitis, left leg: Secondary | ICD-10-CM | POA: Diagnosis not present

## 2023-07-08 DIAGNOSIS — M542 Cervicalgia: Secondary | ICD-10-CM | POA: Diagnosis not present

## 2023-07-12 DIAGNOSIS — H40023 Open angle with borderline findings, high risk, bilateral: Secondary | ICD-10-CM | POA: Diagnosis not present

## 2023-07-19 ENCOUNTER — Encounter (INDEPENDENT_AMBULATORY_CARE_PROVIDER_SITE_OTHER): Payer: Medicare PPO | Admitting: Ophthalmology

## 2023-07-19 DIAGNOSIS — H353132 Nonexudative age-related macular degeneration, bilateral, intermediate dry stage: Secondary | ICD-10-CM

## 2023-07-19 DIAGNOSIS — H43813 Vitreous degeneration, bilateral: Secondary | ICD-10-CM | POA: Diagnosis not present

## 2023-07-19 DIAGNOSIS — H35033 Hypertensive retinopathy, bilateral: Secondary | ICD-10-CM

## 2023-07-19 DIAGNOSIS — I10 Essential (primary) hypertension: Secondary | ICD-10-CM

## 2023-07-28 DIAGNOSIS — H353112 Nonexudative age-related macular degeneration, right eye, intermediate dry stage: Secondary | ICD-10-CM | POA: Diagnosis not present

## 2023-07-29 DIAGNOSIS — M25511 Pain in right shoulder: Secondary | ICD-10-CM | POA: Diagnosis not present

## 2023-07-29 DIAGNOSIS — M7662 Achilles tendinitis, left leg: Secondary | ICD-10-CM | POA: Diagnosis not present

## 2023-07-29 DIAGNOSIS — M542 Cervicalgia: Secondary | ICD-10-CM | POA: Diagnosis not present

## 2023-08-19 DIAGNOSIS — I10 Essential (primary) hypertension: Secondary | ICD-10-CM | POA: Diagnosis not present

## 2023-08-19 DIAGNOSIS — M8580 Other specified disorders of bone density and structure, unspecified site: Secondary | ICD-10-CM | POA: Diagnosis not present

## 2023-08-19 DIAGNOSIS — D649 Anemia, unspecified: Secondary | ICD-10-CM | POA: Diagnosis not present

## 2023-08-19 DIAGNOSIS — E785 Hyperlipidemia, unspecified: Secondary | ICD-10-CM | POA: Diagnosis not present

## 2023-08-19 DIAGNOSIS — E538 Deficiency of other specified B group vitamins: Secondary | ICD-10-CM | POA: Diagnosis not present

## 2023-08-19 DIAGNOSIS — E1129 Type 2 diabetes mellitus with other diabetic kidney complication: Secondary | ICD-10-CM | POA: Diagnosis not present

## 2023-08-19 DIAGNOSIS — E611 Iron deficiency: Secondary | ICD-10-CM | POA: Diagnosis not present

## 2023-08-20 DIAGNOSIS — M7662 Achilles tendinitis, left leg: Secondary | ICD-10-CM | POA: Diagnosis not present

## 2023-08-20 DIAGNOSIS — M25511 Pain in right shoulder: Secondary | ICD-10-CM | POA: Diagnosis not present

## 2023-08-20 DIAGNOSIS — M542 Cervicalgia: Secondary | ICD-10-CM | POA: Diagnosis not present

## 2023-08-25 DIAGNOSIS — Z860101 Personal history of adenomatous and serrated colon polyps: Secondary | ICD-10-CM | POA: Diagnosis not present

## 2023-08-25 DIAGNOSIS — Z1339 Encounter for screening examination for other mental health and behavioral disorders: Secondary | ICD-10-CM | POA: Diagnosis not present

## 2023-08-25 DIAGNOSIS — Z23 Encounter for immunization: Secondary | ICD-10-CM | POA: Diagnosis not present

## 2023-08-25 DIAGNOSIS — Z0000 Encounter for general adult medical examination without abnormal findings: Secondary | ICD-10-CM | POA: Diagnosis not present

## 2023-08-25 DIAGNOSIS — E611 Iron deficiency: Secondary | ICD-10-CM | POA: Diagnosis not present

## 2023-08-25 DIAGNOSIS — R809 Proteinuria, unspecified: Secondary | ICD-10-CM | POA: Diagnosis not present

## 2023-08-25 DIAGNOSIS — Z1331 Encounter for screening for depression: Secondary | ICD-10-CM | POA: Diagnosis not present

## 2023-08-25 DIAGNOSIS — K581 Irritable bowel syndrome with constipation: Secondary | ICD-10-CM | POA: Diagnosis not present

## 2023-08-25 DIAGNOSIS — E785 Hyperlipidemia, unspecified: Secondary | ICD-10-CM | POA: Diagnosis not present

## 2023-08-25 DIAGNOSIS — I10 Essential (primary) hypertension: Secondary | ICD-10-CM | POA: Diagnosis not present

## 2023-08-25 DIAGNOSIS — M8580 Other specified disorders of bone density and structure, unspecified site: Secondary | ICD-10-CM | POA: Diagnosis not present

## 2023-08-25 DIAGNOSIS — E1129 Type 2 diabetes mellitus with other diabetic kidney complication: Secondary | ICD-10-CM | POA: Diagnosis not present

## 2023-08-27 DIAGNOSIS — H353112 Nonexudative age-related macular degeneration, right eye, intermediate dry stage: Secondary | ICD-10-CM | POA: Diagnosis not present

## 2023-09-10 DIAGNOSIS — M7662 Achilles tendinitis, left leg: Secondary | ICD-10-CM | POA: Diagnosis not present

## 2023-09-10 DIAGNOSIS — M25511 Pain in right shoulder: Secondary | ICD-10-CM | POA: Diagnosis not present

## 2023-09-10 DIAGNOSIS — M542 Cervicalgia: Secondary | ICD-10-CM | POA: Diagnosis not present

## 2023-09-26 DIAGNOSIS — H353112 Nonexudative age-related macular degeneration, right eye, intermediate dry stage: Secondary | ICD-10-CM | POA: Diagnosis not present

## 2023-09-27 DIAGNOSIS — M25511 Pain in right shoulder: Secondary | ICD-10-CM | POA: Diagnosis not present

## 2023-09-27 DIAGNOSIS — M7662 Achilles tendinitis, left leg: Secondary | ICD-10-CM | POA: Diagnosis not present

## 2023-09-27 DIAGNOSIS — M542 Cervicalgia: Secondary | ICD-10-CM | POA: Diagnosis not present

## 2023-10-04 ENCOUNTER — Ambulatory Visit: Payer: Medicare PPO | Admitting: Family Medicine

## 2023-10-04 ENCOUNTER — Ambulatory Visit (HOSPITAL_BASED_OUTPATIENT_CLINIC_OR_DEPARTMENT_OTHER)
Admission: RE | Admit: 2023-10-04 | Discharge: 2023-10-04 | Disposition: A | Discharge status: Home / Self Care | Payer: Medicare PPO | Source: Ambulatory Visit | Attending: Family Medicine | Admitting: Family Medicine

## 2023-10-04 VITALS — BP 130/70 | Ht 64.0 in | Wt 124.0 lb

## 2023-10-04 DIAGNOSIS — M25511 Pain in right shoulder: Secondary | ICD-10-CM | POA: Diagnosis not present

## 2023-10-04 DIAGNOSIS — M85811 Other specified disorders of bone density and structure, right shoulder: Secondary | ICD-10-CM | POA: Diagnosis not present

## 2023-10-04 DIAGNOSIS — M50323 Other cervical disc degeneration at C6-C7 level: Secondary | ICD-10-CM | POA: Diagnosis not present

## 2023-10-04 DIAGNOSIS — M4312 Spondylolisthesis, cervical region: Secondary | ICD-10-CM | POA: Diagnosis not present

## 2023-10-04 MED ORDER — TRIAMCINOLONE ACETONIDE 40 MG/ML IJ SUSP
40.0000 mg | Freq: Once | INTRAMUSCULAR | Status: AC
  Administered 2023-10-04: 40 mg via INTRA_ARTICULAR

## 2023-10-04 NOTE — Progress Notes (Unsigned)
 CHIEF COMPLAINT: No chief complaint on file.  _____________________________________________________________ SUBJECTIVE  HPI  Pt is a 78 y.o. female here for evaluation of Right shoulder pain  Ongoing chronically  Always been there off/on for years  In the last 2 months has been a more constant pain  Unable to sleep with it  Getting worse over the years Inciting event: none recalled Primarily located: collarbone, upper scapula, towards the neck Radiating down the upper arm, and go to the index finger Numbness/tingling: none Catching/locking: none Therapies tried so far: Acupuncture (targeted the earlobe, scalp (L), seeding scalp x 4 days (R), shoulder/upper arm) feels the first time she had relief for 3 days but then the pain returned. Has had only short term relief Sees PT q 3 weeks Has also tried meloxicam, takes only when she is having a lot of concerns. Routinely taking 7.5mg  as needed, tried 14mg  over the weekend w/o long term benefit. States she can only take ambien in order to get to sleep  Sport: Plays pickle ball regularly  Prior notes reviewed, dating back 05/2018 without no surgery or injury 08/2017: Pain diffusely throughout the shoulder, sharp and moderate, hurts with overhead activities, no radicular symptoms, intermittent in nature.  Unremarkable shoulder exam, POCUS revealed normal posterior GHJ, ACJ with degenerative changes and effusion, supraspinatus tendinopathy, underwent AC joint injection.  Given nitroglycerin patches for supraspinatus tendinitis, counseled on home exercises 09/2017: AC joint injection improved the pain somewhat, using nitroglycerin patches with improvement, working with PT, provided [place] for [facility] joint arthropathy, continued patches for 3 months 12/2020: Recurrent right shoulder pain, AC joint injection performed 03/2021: Had resolution with [facility] joint injection but pain returned, underwent intra-articular shoulder injection, PT was  scheduled   Medical history includes osteoporosis, hypertension, diabetes ------------------------------------------------------------------------------------------------------ Past Medical History:  Diagnosis Date   Diabetes (HCC)    Hypertension    Insomnia    Osteoporosis     No past surgical history on file.    Outpatient Encounter Medications as of 10/04/2023  Medication Sig   cholecalciferol (VITAMIN D) 400 UNITS TABS Take 1,000 Units by mouth daily.   cyanocobalamin 1000 MCG tablet Take 100 mcg by mouth daily.   estradiol (ESTRACE) 0.1 MG/GM vaginal cream Place 2 g vaginally daily.   ezetimibe-simvastatin (VYTORIN) 10-20 MG per tablet Take 1 tablet by mouth at bedtime.   fish oil-omega-3 fatty acids 1000 MG capsule Take 1,200 mg by mouth 3 (three) times daily.   Magnesium 400 MG TABS Take by mouth daily.   nitroGLYCERIN (NITRODUR - DOSED IN MG/24 HR) 0.2 mg/hr patch Cut and apply 1/4 patch to most painful area q24h.   olmesartan (BENICAR) 20 MG tablet Take 20 mg by mouth daily.   Probiotic Product (PROBIOTIC-10 PO) Take 100 mg by mouth daily.   psyllium (METAMUCIL) 58.6 % packet Take 1 packet by mouth daily.   zolpidem (AMBIEN) 10 MG tablet Take 10 mg by mouth as needed.   No facility-administered encounter medications on file as of 10/04/2023.    ------------------------------------------------------------------------------------------------------  _____________________________________________________________ OBJECTIVE  PHYSICAL EXAM  Today's Vitals   10/04/23 1007  BP: 130/70  Weight: 124 lb (56.2 kg)  Height: 5\' 4"  (1.626 m)   Body mass index is 21.28 kg/m.   reviewed  General: A+Ox3, no acute distress, well-nourished, appropriate affect CV: pulses 2+ regular, nondiaphoretic, no peripheral edema, cap refill <2sec Lungs: no audible wheezing, non-labored breathing, bilateral chest rise/fall, nontachypneic Skin: warm, well-perfused, non-icteric, no susp  lesions or rashes Neuro:  Sensation  intact, muscle tone wnl, no atrophy Psych: no signs of depression or anxiety MSK:  R Shoulder:  No deformity, swelling or muscle wasting FF 180, abd 180, mildly reduced active ER on the R, equivalent passive ER TTP R trapezius musculature diffusely, ACJ, medial clavicle, anterolateral humerus, biceps groove, subacromial space NTTP over the [place], coracoid, deltoid, scap spine, cervical spine +neer, hawkins, equivocal empty can also provocative of trap pain, +scarf, +hornblower, neg belly press, +speed, + yergason, neg obrien, armpit pain with resisted IR, anterior shoulder pain with resisted ER Neg neer, hawkins, empty can, scarf test, hornblower, resisted anterior flexion, subscap liftoff, speeds, obriens Neg ant drawer, sulcus sign Neg apprehension Negative Spurling's test bilat Some limitations with neck extension, trap pain reproduced with neck rotation to the right  _____________________________________________________________ ASSESSMENT/PLAN Diagnoses and all orders for this visit:  Acute pain of right shoulder -     DG Shoulder Right; Future -     DG Cervical Spine Complete; Future -     triamcinolone acetonide (KENALOG-40) injection 40 mg   Acute on chronic shoulder pain, exam findings suspicious for RTC tear suspected chronic in setting of no recent inciting event, also with exam findings suspicious for biceps involvement, OA. Obtaining R shoulder XR for further review. C-spine ordered to evaluate for possible concomitant cervical etiology of radiating pain. Options discussed for management. Will continue with PT. Interested in SAB CSI today  Subacromial Bursa Injection Site: right  After PARQ discussed and verbal consent given, the site was cleaned with alcohol. Steroid injection was performed using sterile technique with 4mL 1% lidocaine, 1mL 40mg /mL kenalog and 22G 1.5in needle. This was well tolerated and resulted in some relief. Dressing  placed and post injection instructions were given including a discussion of likely return of pain today after the anesthetic wears off (with the possibility of worsened pain) until the steroid starts to work in 1-3 days.   Pt shares she would be interested in intraarticular shoulder injection if warranted, pending films. Plan to monitor improvement of shoulder pain following SAB CSI to help with planning. All questions answered. Return precautions discussed; follow-up for additional procedure pending symptoms and XR. Patient verbalized understanding and is in agreement with plan.   Electronically signed by: Burna Forts, MD 10/04/2023 7:27 AM

## 2023-10-06 ENCOUNTER — Telehealth: Payer: Self-pay | Admitting: Family Medicine

## 2023-10-06 NOTE — Telephone Encounter (Signed)
 Retroactive documentation: Contacted patient 10/05/23 to discuss XR findings, which include rotator cuff tear arthropathy, mild ACJ, cervical spondylolisthesis, DDD. Pt shares she has relayed XR findings to PT and plans to work with him accordingly to assist with rehab. Has had increasing improvement to shoulder pain since injection that was performed yesterday (10/04/23), unsure of whether an intraarticular CSI is indicated. Options discussed regarding management and follow-up. Preemptively scheduled later this week for a possible procedure should shoulder pain recur over the week. If continuing to improve, patient would like to trial an intraarticular CSI of the shoulder as she is not interested in pursuing surgery at this time. OTC therapies reviewed, all questions answered. Return precautions discussed. Patient verbalized understanding and is in agreement with plan.

## 2023-10-08 ENCOUNTER — Ambulatory Visit: Payer: Medicare PPO | Admitting: Family Medicine

## 2023-10-08 NOTE — Progress Notes (Deleted)
 CHIEF COMPLAINT: No chief complaint on file.  _____________________________________________________________ SUBJECTIVE  HPI  Pt is a 78 y.o. female here for follow-up of right shoulder pain X-ray results discussed over the phone, please see note.  Charlesia is interested in an ultrasound-guided intra-articular shoulder injection with today Following bursa injection offered interval improvement, lasting_days No change in quality, location of pain; no numbness or tingling  ------------------------------------------------------------------------------------------------------ Past Medical History:  Diagnosis Date   Diabetes (HCC)    Hypertension    Insomnia    Osteoporosis     No past surgical history on file.    Outpatient Encounter Medications as of 10/08/2023  Medication Sig   cholecalciferol (VITAMIN D) 400 UNITS TABS Take 1,000 Units by mouth daily.   cyanocobalamin 1000 MCG tablet Take 100 mcg by mouth daily.   estradiol (ESTRACE) 0.1 MG/GM vaginal cream Place 2 g vaginally daily.   ezetimibe-simvastatin (VYTORIN) 10-20 MG per tablet Take 1 tablet by mouth at bedtime.   fish oil-omega-3 fatty acids 1000 MG capsule Take 1,200 mg by mouth 3 (three) times daily.   Magnesium 400 MG TABS Take by mouth daily.   nitroGLYCERIN (NITRODUR - DOSED IN MG/24 HR) 0.2 mg/hr patch Cut and apply 1/4 patch to most painful area q24h.   olmesartan (BENICAR) 20 MG tablet Take 20 mg by mouth daily.   Probiotic Product (PROBIOTIC-10 PO) Take 100 mg by mouth daily.   psyllium (METAMUCIL) 58.6 % packet Take 1 packet by mouth daily.   zolpidem (AMBIEN) 10 MG tablet Take 10 mg by mouth as needed.   No facility-administered encounter medications on file as of 10/08/2023.    ------------------------------------------------------------------------------------------------------  _____________________________________________________________ OBJECTIVE  PHYSICAL EXAM  There were no vitals filed for this  visit. There is no height or weight on file to calculate BMI.   reviewed  General: A+Ox3, no acute distress, well-nourished, appropriate affect CV: pulses 2+ regular, nondiaphoretic, no peripheral edema, cap refill <2sec Lungs: no audible wheezing, non-labored breathing, bilateral chest rise/fall, nontachypneic Skin: warm, well-perfused, non-icteric, no susp lesions or rashes Neuro:  Sensation intact, muscle tone wnl, no atrophy Psych: no signs of depression or anxiety MSK: ***      _____________________________________________________________ ASSESSMENT/PLAN There are no diagnoses linked to this encounter.  Ultrasound Guided Glenohumeral Joint Injection Side: Right  After PARQ discussed and consent was given verbally the site was cleaned with chlorhexidine prep. An ultrasound transducer was placed on the posterior shoulder. The posterior capsule, labrum, and infraspinatus were identified. The intended injection site was anesthetized with ethyl chloride spray. An injection was performed under ultrasound guidance with sterile technique using 4mL 1% lidocaine without epinephrine and 1mL 40mg /mL kenalog. This was well tolerated and resulted in {Desc; no/some/marked:19219} relief. Needle removed and dressing placed and post injection instructions were given including a discussion of likely return of pain today after the anesthetic wears off (with the possibility of worsened pain) until the steroid starts to work in 1-3 days. Pt was advised to call or return to clinic if these symptoms worsen or fail to improve as anticipated.    Electronically signed by: Burna Forts, MD 10/08/2023 7:07 AM

## 2023-10-18 DIAGNOSIS — M542 Cervicalgia: Secondary | ICD-10-CM | POA: Diagnosis not present

## 2023-10-18 DIAGNOSIS — M25511 Pain in right shoulder: Secondary | ICD-10-CM | POA: Diagnosis not present

## 2023-10-18 DIAGNOSIS — M7662 Achilles tendinitis, left leg: Secondary | ICD-10-CM | POA: Diagnosis not present

## 2023-10-21 ENCOUNTER — Encounter (INDEPENDENT_AMBULATORY_CARE_PROVIDER_SITE_OTHER): Admitting: Ophthalmology

## 2023-10-21 DIAGNOSIS — H353122 Nonexudative age-related macular degeneration, left eye, intermediate dry stage: Secondary | ICD-10-CM

## 2023-10-21 DIAGNOSIS — H43813 Vitreous degeneration, bilateral: Secondary | ICD-10-CM | POA: Diagnosis not present

## 2023-10-21 DIAGNOSIS — I10 Essential (primary) hypertension: Secondary | ICD-10-CM | POA: Diagnosis not present

## 2023-10-21 DIAGNOSIS — H35033 Hypertensive retinopathy, bilateral: Secondary | ICD-10-CM | POA: Diagnosis not present

## 2023-10-21 DIAGNOSIS — H353211 Exudative age-related macular degeneration, right eye, with active choroidal neovascularization: Secondary | ICD-10-CM | POA: Diagnosis not present

## 2023-10-26 DIAGNOSIS — H353112 Nonexudative age-related macular degeneration, right eye, intermediate dry stage: Secondary | ICD-10-CM | POA: Diagnosis not present

## 2023-10-29 DIAGNOSIS — M25511 Pain in right shoulder: Secondary | ICD-10-CM | POA: Diagnosis not present

## 2023-10-29 DIAGNOSIS — M542 Cervicalgia: Secondary | ICD-10-CM | POA: Diagnosis not present

## 2023-10-29 DIAGNOSIS — M7662 Achilles tendinitis, left leg: Secondary | ICD-10-CM | POA: Diagnosis not present

## 2023-11-08 DIAGNOSIS — M7662 Achilles tendinitis, left leg: Secondary | ICD-10-CM | POA: Diagnosis not present

## 2023-11-08 DIAGNOSIS — M542 Cervicalgia: Secondary | ICD-10-CM | POA: Diagnosis not present

## 2023-11-08 DIAGNOSIS — M25511 Pain in right shoulder: Secondary | ICD-10-CM | POA: Diagnosis not present

## 2023-11-15 DIAGNOSIS — M7662 Achilles tendinitis, left leg: Secondary | ICD-10-CM | POA: Diagnosis not present

## 2023-11-15 DIAGNOSIS — M25511 Pain in right shoulder: Secondary | ICD-10-CM | POA: Diagnosis not present

## 2023-11-15 DIAGNOSIS — M542 Cervicalgia: Secondary | ICD-10-CM | POA: Diagnosis not present

## 2023-11-17 ENCOUNTER — Encounter (INDEPENDENT_AMBULATORY_CARE_PROVIDER_SITE_OTHER): Admitting: Ophthalmology

## 2023-11-17 DIAGNOSIS — H353122 Nonexudative age-related macular degeneration, left eye, intermediate dry stage: Secondary | ICD-10-CM

## 2023-11-17 DIAGNOSIS — H43813 Vitreous degeneration, bilateral: Secondary | ICD-10-CM | POA: Diagnosis not present

## 2023-11-17 DIAGNOSIS — I10 Essential (primary) hypertension: Secondary | ICD-10-CM

## 2023-11-17 DIAGNOSIS — H35033 Hypertensive retinopathy, bilateral: Secondary | ICD-10-CM | POA: Diagnosis not present

## 2023-11-17 DIAGNOSIS — H353211 Exudative age-related macular degeneration, right eye, with active choroidal neovascularization: Secondary | ICD-10-CM | POA: Diagnosis not present

## 2023-11-24 DIAGNOSIS — M542 Cervicalgia: Secondary | ICD-10-CM | POA: Diagnosis not present

## 2023-11-24 DIAGNOSIS — M25511 Pain in right shoulder: Secondary | ICD-10-CM | POA: Diagnosis not present

## 2023-11-24 DIAGNOSIS — M7662 Achilles tendinitis, left leg: Secondary | ICD-10-CM | POA: Diagnosis not present

## 2023-12-13 DIAGNOSIS — M7662 Achilles tendinitis, left leg: Secondary | ICD-10-CM | POA: Diagnosis not present

## 2023-12-13 DIAGNOSIS — M542 Cervicalgia: Secondary | ICD-10-CM | POA: Diagnosis not present

## 2023-12-13 DIAGNOSIS — M25511 Pain in right shoulder: Secondary | ICD-10-CM | POA: Diagnosis not present

## 2023-12-14 ENCOUNTER — Encounter (INDEPENDENT_AMBULATORY_CARE_PROVIDER_SITE_OTHER): Admitting: Ophthalmology

## 2023-12-14 DIAGNOSIS — H35033 Hypertensive retinopathy, bilateral: Secondary | ICD-10-CM | POA: Diagnosis not present

## 2023-12-14 DIAGNOSIS — H43813 Vitreous degeneration, bilateral: Secondary | ICD-10-CM

## 2023-12-14 DIAGNOSIS — H353122 Nonexudative age-related macular degeneration, left eye, intermediate dry stage: Secondary | ICD-10-CM

## 2023-12-14 DIAGNOSIS — H353211 Exudative age-related macular degeneration, right eye, with active choroidal neovascularization: Secondary | ICD-10-CM | POA: Diagnosis not present

## 2023-12-14 DIAGNOSIS — I10 Essential (primary) hypertension: Secondary | ICD-10-CM | POA: Diagnosis not present

## 2023-12-16 DIAGNOSIS — N952 Postmenopausal atrophic vaginitis: Secondary | ICD-10-CM | POA: Diagnosis not present

## 2023-12-16 DIAGNOSIS — Z1231 Encounter for screening mammogram for malignant neoplasm of breast: Secondary | ICD-10-CM | POA: Diagnosis not present

## 2023-12-16 DIAGNOSIS — Z01419 Encounter for gynecological examination (general) (routine) without abnormal findings: Secondary | ICD-10-CM | POA: Diagnosis not present

## 2023-12-16 DIAGNOSIS — Z6821 Body mass index (BMI) 21.0-21.9, adult: Secondary | ICD-10-CM | POA: Diagnosis not present

## 2023-12-27 DIAGNOSIS — M25511 Pain in right shoulder: Secondary | ICD-10-CM | POA: Diagnosis not present

## 2023-12-27 DIAGNOSIS — M542 Cervicalgia: Secondary | ICD-10-CM | POA: Diagnosis not present

## 2023-12-27 DIAGNOSIS — M7662 Achilles tendinitis, left leg: Secondary | ICD-10-CM | POA: Diagnosis not present

## 2024-01-07 ENCOUNTER — Telehealth: Payer: Self-pay | Admitting: Sports Medicine

## 2024-01-07 DIAGNOSIS — M25511 Pain in right shoulder: Secondary | ICD-10-CM | POA: Diagnosis not present

## 2024-01-07 DIAGNOSIS — M542 Cervicalgia: Secondary | ICD-10-CM | POA: Diagnosis not present

## 2024-01-07 DIAGNOSIS — M7662 Achilles tendinitis, left leg: Secondary | ICD-10-CM | POA: Diagnosis not present

## 2024-01-07 NOTE — Telephone Encounter (Signed)
 Patient scheduled for right shoulder evaluation on Friday 01/14/2024 at 8:45am.

## 2024-01-07 NOTE — Telephone Encounter (Signed)
 Patient called and she was returning your call. CB#[phone]

## 2024-01-11 ENCOUNTER — Encounter (INDEPENDENT_AMBULATORY_CARE_PROVIDER_SITE_OTHER): Admitting: Ophthalmology

## 2024-01-11 DIAGNOSIS — H353122 Nonexudative age-related macular degeneration, left eye, intermediate dry stage: Secondary | ICD-10-CM

## 2024-01-11 DIAGNOSIS — H35033 Hypertensive retinopathy, bilateral: Secondary | ICD-10-CM

## 2024-01-11 DIAGNOSIS — H43813 Vitreous degeneration, bilateral: Secondary | ICD-10-CM

## 2024-01-11 DIAGNOSIS — H353211 Exudative age-related macular degeneration, right eye, with active choroidal neovascularization: Secondary | ICD-10-CM

## 2024-01-11 DIAGNOSIS — I10 Essential (primary) hypertension: Secondary | ICD-10-CM | POA: Diagnosis not present

## 2024-01-13 DIAGNOSIS — H353122 Nonexudative age-related macular degeneration, left eye, intermediate dry stage: Secondary | ICD-10-CM | POA: Diagnosis not present

## 2024-01-13 DIAGNOSIS — E119 Type 2 diabetes mellitus without complications: Secondary | ICD-10-CM | POA: Diagnosis not present

## 2024-01-13 DIAGNOSIS — H40023 Open angle with borderline findings, high risk, bilateral: Secondary | ICD-10-CM | POA: Diagnosis not present

## 2024-01-13 DIAGNOSIS — H353211 Exudative age-related macular degeneration, right eye, with active choroidal neovascularization: Secondary | ICD-10-CM | POA: Diagnosis not present

## 2024-01-14 ENCOUNTER — Other Ambulatory Visit: Payer: Self-pay

## 2024-01-14 ENCOUNTER — Ambulatory Visit: Admitting: Sports Medicine

## 2024-01-14 ENCOUNTER — Encounter: Payer: Self-pay | Admitting: Sports Medicine

## 2024-01-14 DIAGNOSIS — M12811 Other specific arthropathies, not elsewhere classified, right shoulder: Secondary | ICD-10-CM

## 2024-01-14 DIAGNOSIS — M5030 Other cervical disc degeneration, unspecified cervical region: Secondary | ICD-10-CM | POA: Diagnosis not present

## 2024-01-14 DIAGNOSIS — M25511 Pain in right shoulder: Secondary | ICD-10-CM | POA: Diagnosis not present

## 2024-01-14 DIAGNOSIS — G8929 Other chronic pain: Secondary | ICD-10-CM | POA: Diagnosis not present

## 2024-01-14 MED ORDER — BUPIVACAINE HCL 0.25 % IJ SOLN
2.0000 mL | INTRAMUSCULAR | Status: AC | PRN
  Administered 2024-01-14: 2 mL via INTRA_ARTICULAR

## 2024-01-14 MED ORDER — LIDOCAINE HCL 1 % IJ SOLN
2.0000 mL | INTRAMUSCULAR | Status: AC | PRN
  Administered 2024-01-14: 2 mL

## 2024-01-14 MED ORDER — METHYLPREDNISOLONE ACETATE 40 MG/ML IJ SUSP
40.0000 mg | INTRAMUSCULAR | Status: AC | PRN
  Administered 2024-01-14: 40 mg via INTRA_ARTICULAR

## 2024-01-14 NOTE — Progress Notes (Signed)
 Jillian Simon - 78 y.o. female MRN [id number]  Date of birth: 12/01/1945  Office Visit Note: Visit Date: 01/14/2024 PCP: Jeannine Milroy., MD Referred by: Jeannine Milroy., MD  Subjective: Chief Complaint  Patient presents with   Right Shoulder - Pain   HPI: Jillian Simon is a pleasant 78 y.o. female who presents today for acute on chronic right shoulder pain. She is RHD.  [name] has had chronic right shoulder pain for many years that has been on and off.  She works with [facility] for both the shoulder as well as her neck as she has had some whiplash from previous injury.  Her right shoulder pain however started getting much worse in December 2024.  This is interfering with activities of daily living, and is also keeping her up awake at night because of the pain.  She had to stop playing pickle ball in February of this year because of the pain.  Back on February 17 she did have an ultrasound-guided subacromial joint injection which gave her significant relief of her pain initially.  She had improvement for about 6 weeks and then her pain started to return and was worsened by some of her therapy.  She uses meloxicam as needed.  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  Pertinent ROS were reviewed with the patient and found to be negative unless otherwise specified above in HPI.   Assessment & Plan: Visit Diagnoses:  1. Rotator cuff arthropathy, right   2. Chronic right shoulder pain   3. DDD (degenerative disc disease), cervical    Plan: Impression is chronic right shoulder pain with recent exacerbation.  Her x-rays do show a high riding humeral head, indicative of rotator cuff tearing/insufficiency.  Her physical exam has weakness and associated with pain with testing of the rotator cuff, specifically the supraspinatus and infraspinatus and I am highly concerned for full-thickness tearing in these locations given she has persistent weakness and pain despite  many months of formalized physical therapy and a previous subacromial joint injection.  She does have a mild effusion on the shoulder today, given this and her for current pain relief, we did proceed with ultrasound-guided glenohumeral joint injection, patient tolerated well.  Advised on postinjection protocol.  She may continue her therapy for her neck as well as doing some for the rotator cuff, although I would like her to focus on more static motion and range of motion and avoid vigorous strengthening of the rotator cuff until her MRI returns.  I will follow-up with her 1 week after MRI to review and discuss next steps.  She may use her meloxicam 15 mg daily as needed for any pain or inflammation.  Follow-up: Return for 1-week after MRI .   Meds & Orders: No orders of the defined types were placed in this encounter.   Orders Placed This Encounter  Procedures   Large Joint Inj: R glenohumeral   US  Guided Needle Placement - No Linked Charges   MR SHOULDER RIGHT WO CONTRAST     Procedures: Large Joint Inj: R glenohumeral on 01/14/2024 9:41 AM Indications: pain Details: 22 G 3.5 in needle, ultrasound-guided posterior approach Medications: 2 mL lidocaine 1 %; 2 mL bupivacaine 0.25 %; 40 mg methylPREDNISolone  acetate 40 MG/ML Outcome: tolerated well, no immediate complications  US -guided glenohumeral joint injection, right shoulder After discussion on risks/benefits/indications, informed verbal consent was obtained. A timeout was then performed. The patient was positioned lying lateral recumbent  on examination table. The patient's shoulder was prepped with betadine and multiple alcohol  swabs and utilizing ultrasound guidance, the patient's glenohumeral joint was identified on ultrasound. Using ultrasound guidance a 22-gauge, 3.5 inch needle with a mixture of 2:2:1 cc's lidocaine:bupivicaine:depomedrol was directed from a lateral to medial direction via in-plane technique into the glenohumeral joint  with visualization of appropriate spread of injectate into the joint. Patient tolerated the procedure well without immediate complications.      Procedure, treatment alternatives, risks and benefits explained, specific risks discussed. Consent was given by the patient. Immediately prior to procedure a time out was called to verify the correct patient, procedure, equipment, support staff and site/side marked as required. Patient was prepped and draped in the usual sterile fashion.          Clinical History: No specialty comments available.  She reports that she has never smoked. She has never used smokeless tobacco. No results for input(s): "HGBA1C", "LABURIC" in the last 8760 hours.  Objective:    Physical Exam  Gen: Well-appearing, in no acute distress; non-toxic CV: Well-perfused. Warm.  Resp: Breathing unlabored on room air; no wheezing. Psych: Fluid speech in conversation; appropriate affect; normal thought process  Ortho Exam - Right shoulder: There is a small effusion within the right shoulder joint.  No AC joint TTP.  There is full active and passive range of motion in all directions.  Positive drop arm test.  There is weakness with resisted abduction. + Pain and weakness with resisted external rotation, internal strength is preserved.  Positive speeds test.  Negative O'Brien's and impingement testing.  4/5 strength of abduction and 3/5 strength with external rotation.  Imaging:  *I did review both cervical spine x-ray and right shoulder x-ray from 10/04/2023 during the visit today.  DG Shoulder Right CLINICAL DATA:  Right shoulder pain.  EXAM: RIGHT SHOULDER - 2+ VIEW  COMPARISON:  None Available.  FINDINGS: There is no acute fracture or dislocation. There is elevation of the right humeral head suggestive of chronic rotator cuff injury. The bones are osteopenic. The soft tissues are unremarkable.  IMPRESSION: 1. No acute fracture or dislocation. 2. Findings  suggestive of chronic rotator cuff injury.  Electronically Signed   By: Angus Bark M.D.   On: 10/04/2023 15:03 DG Cervical Spine Complete CLINICAL DATA:  78 year old female with pain radiating to the right shoulder, scapula, past the elbow.  EXAM: CERVICAL SPINE - COMPLETE 4+ VIEW  COMPARISON:  None Available.  FINDINGS: Mild straightening of cervical lordosis plus subtle degenerative appearing anterolisthesis of C4 on C5, up to 2 mm). Bilateral posterior element alignment is within normal limits. Cervicothoracic junction alignment is within normal limits. AP alignment, C1-C2 alignment are within normal limits.  Bone mineralization is within normal limits for age. Mild for age cervical facet hypertrophy despite the mild spondylolisthesis. Disc space loss in the cervical spine primarily at C6-C7, moderate. Mild endplate spurring at multiple levels.  No acute osseous abnormality identified.  Negative visible thoracic inlet.  IMPRESSION: 1. No acute osseous abnormality identified in the cervical spine. 2. Generally mild for age cervical spine degeneration, despite mild degenerative appearing spondylolisthesis at C4-C5. Disc degeneration appears maximal at C6-C7.  Electronically Signed   By: Marlise Simpers M.D.   On: 10/04/2023 12:49   Past Medical/Family/Surgical/Social History: Medications & Allergies reviewed per EMR, new medications updated. Patient Active Problem List   Diagnosis Date Noted   Capsulitis of right shoulder 03/17/2021   Right supraspinatus tendinitis 08/20/2017  AC joint arthropathy 08/20/2017   Past Medical History:  Diagnosis Date   Diabetes (HCC)    Hypertension    Insomnia    Osteoporosis    History reviewed. No pertinent family history. History reviewed. No pertinent surgical history. Social History   Occupational History   Not on file  Tobacco Use   Smoking status: Never   Smokeless tobacco: Never  Substance and Sexual Activity    Alcohol  use: No   Drug use: No   Sexual activity: Not on file

## 2024-01-14 NOTE — Progress Notes (Signed)
 Patient says that she has had right shoulder pain for many years that has gotten especially bad since last fall. She has worked with [facility] Abe for years for her shoulder, as well as her neck and hip. She does have a Meloxicam prescription, but only takes part of a dose occasionally as needed. She enjoys playing pickleball and she plays cards with her husband every night, but has not been able to do either of those activities lately due to pain. Her pain is also preventing her from sleeping. She saw a sports medicine doctor in February and had x-rays and a subacromial bursa injection which did give her good temporary relief. She says that she was given the option to return later that week for an injection into the joint, but says that a couple of days after the first injection her pain was resolved, so she did not need the additional injection. She would like to do anything short of surgery, as she has sphincter of oddi dysfunction and cannot take opioids without flaring up her pain.

## 2024-01-17 ENCOUNTER — Encounter: Payer: Self-pay | Admitting: Sports Medicine

## 2024-01-19 ENCOUNTER — Encounter (INDEPENDENT_AMBULATORY_CARE_PROVIDER_SITE_OTHER): Payer: Medicare PPO | Admitting: Ophthalmology

## 2024-01-31 DIAGNOSIS — M25511 Pain in right shoulder: Secondary | ICD-10-CM | POA: Diagnosis not present

## 2024-01-31 DIAGNOSIS — M542 Cervicalgia: Secondary | ICD-10-CM | POA: Diagnosis not present

## 2024-01-31 DIAGNOSIS — M7662 Achilles tendinitis, left leg: Secondary | ICD-10-CM | POA: Diagnosis not present

## 2024-02-01 ENCOUNTER — Encounter: Payer: Self-pay | Admitting: Sports Medicine

## 2024-02-04 ENCOUNTER — Ambulatory Visit
Admission: RE | Admit: 2024-02-04 | Discharge: 2024-02-04 | Disposition: A | Discharge status: Home / Self Care | Source: Ambulatory Visit | Attending: Sports Medicine | Admitting: Sports Medicine

## 2024-02-04 ENCOUNTER — Ambulatory Visit: Payer: Self-pay | Admitting: Sports Medicine

## 2024-02-04 DIAGNOSIS — M12811 Other specific arthropathies, not elsewhere classified, right shoulder: Secondary | ICD-10-CM

## 2024-02-04 DIAGNOSIS — G8929 Other chronic pain: Secondary | ICD-10-CM

## 2024-02-04 DIAGNOSIS — M19011 Primary osteoarthritis, right shoulder: Secondary | ICD-10-CM | POA: Diagnosis not present

## 2024-02-04 DIAGNOSIS — M75101 Unspecified rotator cuff tear or rupture of right shoulder, not specified as traumatic: Secondary | ICD-10-CM | POA: Diagnosis not present

## 2024-02-04 DIAGNOSIS — M25511 Pain in right shoulder: Secondary | ICD-10-CM | POA: Diagnosis not present

## 2024-02-08 ENCOUNTER — Encounter (INDEPENDENT_AMBULATORY_CARE_PROVIDER_SITE_OTHER): Admitting: Ophthalmology

## 2024-02-08 DIAGNOSIS — H353122 Nonexudative age-related macular degeneration, left eye, intermediate dry stage: Secondary | ICD-10-CM

## 2024-02-08 DIAGNOSIS — H353211 Exudative age-related macular degeneration, right eye, with active choroidal neovascularization: Secondary | ICD-10-CM

## 2024-02-08 DIAGNOSIS — H35033 Hypertensive retinopathy, bilateral: Secondary | ICD-10-CM

## 2024-02-08 DIAGNOSIS — H43813 Vitreous degeneration, bilateral: Secondary | ICD-10-CM | POA: Diagnosis not present

## 2024-02-08 DIAGNOSIS — I10 Essential (primary) hypertension: Secondary | ICD-10-CM

## 2024-02-09 DIAGNOSIS — M19211 Secondary osteoarthritis, right shoulder: Secondary | ICD-10-CM | POA: Diagnosis not present

## 2024-02-15 ENCOUNTER — Ambulatory Visit: Admitting: Sports Medicine

## 2024-02-28 DIAGNOSIS — H409 Unspecified glaucoma: Secondary | ICD-10-CM | POA: Diagnosis not present

## 2024-02-28 DIAGNOSIS — E1129 Type 2 diabetes mellitus with other diabetic kidney complication: Secondary | ICD-10-CM | POA: Diagnosis not present

## 2024-02-28 DIAGNOSIS — M8589 Other specified disorders of bone density and structure, multiple sites: Secondary | ICD-10-CM | POA: Diagnosis not present

## 2024-02-28 DIAGNOSIS — I10 Essential (primary) hypertension: Secondary | ICD-10-CM | POA: Diagnosis not present

## 2024-02-28 DIAGNOSIS — M25511 Pain in right shoulder: Secondary | ICD-10-CM | POA: Diagnosis not present

## 2024-02-28 DIAGNOSIS — F5101 Primary insomnia: Secondary | ICD-10-CM | POA: Diagnosis not present

## 2024-02-28 DIAGNOSIS — M8580 Other specified disorders of bone density and structure, unspecified site: Secondary | ICD-10-CM | POA: Diagnosis not present

## 2024-02-28 DIAGNOSIS — H353210 Exudative age-related macular degeneration, right eye, stage unspecified: Secondary | ICD-10-CM | POA: Diagnosis not present

## 2024-02-28 DIAGNOSIS — E785 Hyperlipidemia, unspecified: Secondary | ICD-10-CM | POA: Diagnosis not present

## 2024-02-29 DIAGNOSIS — M75121 Complete rotator cuff tear or rupture of right shoulder, not specified as traumatic: Secondary | ICD-10-CM | POA: Diagnosis not present

## 2024-02-29 DIAGNOSIS — M7551 Bursitis of right shoulder: Secondary | ICD-10-CM | POA: Diagnosis not present

## 2024-02-29 DIAGNOSIS — M7541 Impingement syndrome of right shoulder: Secondary | ICD-10-CM | POA: Diagnosis not present

## 2024-02-29 DIAGNOSIS — M7581 Other shoulder lesions, right shoulder: Secondary | ICD-10-CM | POA: Diagnosis not present

## 2024-02-29 DIAGNOSIS — G8918 Other acute postprocedural pain: Secondary | ICD-10-CM | POA: Diagnosis not present

## 2024-02-29 DIAGNOSIS — M7521 Bicipital tendinitis, right shoulder: Secondary | ICD-10-CM | POA: Diagnosis not present

## 2024-02-29 DIAGNOSIS — S46111A Strain of muscle, fascia and tendon of long head of biceps, right arm, initial encounter: Secondary | ICD-10-CM | POA: Diagnosis not present

## 2024-02-29 DIAGNOSIS — M24111 Other articular cartilage disorders, right shoulder: Secondary | ICD-10-CM | POA: Diagnosis not present

## 2024-03-07 ENCOUNTER — Encounter (INDEPENDENT_AMBULATORY_CARE_PROVIDER_SITE_OTHER): Admitting: Ophthalmology

## 2024-03-08 ENCOUNTER — Encounter (INDEPENDENT_AMBULATORY_CARE_PROVIDER_SITE_OTHER): Admitting: Ophthalmology

## 2024-03-08 DIAGNOSIS — H35033 Hypertensive retinopathy, bilateral: Secondary | ICD-10-CM | POA: Diagnosis not present

## 2024-03-08 DIAGNOSIS — I10 Essential (primary) hypertension: Secondary | ICD-10-CM

## 2024-03-08 DIAGNOSIS — H43813 Vitreous degeneration, bilateral: Secondary | ICD-10-CM

## 2024-03-08 DIAGNOSIS — H353211 Exudative age-related macular degeneration, right eye, with active choroidal neovascularization: Secondary | ICD-10-CM | POA: Diagnosis not present

## 2024-03-08 DIAGNOSIS — H353122 Nonexudative age-related macular degeneration, left eye, intermediate dry stage: Secondary | ICD-10-CM | POA: Diagnosis not present

## 2024-03-14 DIAGNOSIS — M25511 Pain in right shoulder: Secondary | ICD-10-CM | POA: Diagnosis not present

## 2024-03-17 DIAGNOSIS — M25511 Pain in right shoulder: Secondary | ICD-10-CM | POA: Diagnosis not present

## 2024-03-20 DIAGNOSIS — M25511 Pain in right shoulder: Secondary | ICD-10-CM | POA: Diagnosis not present

## 2024-03-22 DIAGNOSIS — M25511 Pain in right shoulder: Secondary | ICD-10-CM | POA: Diagnosis not present

## 2024-03-27 DIAGNOSIS — M25511 Pain in right shoulder: Secondary | ICD-10-CM | POA: Diagnosis not present

## 2024-03-29 DIAGNOSIS — M25511 Pain in right shoulder: Secondary | ICD-10-CM | POA: Diagnosis not present

## 2024-03-31 DIAGNOSIS — H0288A Meibomian gland dysfunction right eye, upper and lower eyelids: Secondary | ICD-10-CM | POA: Diagnosis not present

## 2024-03-31 DIAGNOSIS — H0288B Meibomian gland dysfunction left eye, upper and lower eyelids: Secondary | ICD-10-CM | POA: Diagnosis not present

## 2024-04-03 DIAGNOSIS — M25511 Pain in right shoulder: Secondary | ICD-10-CM | POA: Diagnosis not present

## 2024-04-04 ENCOUNTER — Encounter (INDEPENDENT_AMBULATORY_CARE_PROVIDER_SITE_OTHER): Admitting: Ophthalmology

## 2024-04-04 DIAGNOSIS — H43813 Vitreous degeneration, bilateral: Secondary | ICD-10-CM | POA: Diagnosis not present

## 2024-04-04 DIAGNOSIS — I10 Essential (primary) hypertension: Secondary | ICD-10-CM | POA: Diagnosis not present

## 2024-04-04 DIAGNOSIS — H353211 Exudative age-related macular degeneration, right eye, with active choroidal neovascularization: Secondary | ICD-10-CM | POA: Diagnosis not present

## 2024-04-04 DIAGNOSIS — H35033 Hypertensive retinopathy, bilateral: Secondary | ICD-10-CM

## 2024-04-04 DIAGNOSIS — H353122 Nonexudative age-related macular degeneration, left eye, intermediate dry stage: Secondary | ICD-10-CM

## 2024-04-10 DIAGNOSIS — M25511 Pain in right shoulder: Secondary | ICD-10-CM | POA: Diagnosis not present

## 2024-04-12 DIAGNOSIS — M25511 Pain in right shoulder: Secondary | ICD-10-CM | POA: Diagnosis not present

## 2024-04-14 DIAGNOSIS — H0288A Meibomian gland dysfunction right eye, upper and lower eyelids: Secondary | ICD-10-CM | POA: Diagnosis not present

## 2024-04-14 DIAGNOSIS — H0288B Meibomian gland dysfunction left eye, upper and lower eyelids: Secondary | ICD-10-CM | POA: Diagnosis not present

## 2024-04-20 DIAGNOSIS — M25511 Pain in right shoulder: Secondary | ICD-10-CM | POA: Diagnosis not present

## 2024-04-24 DIAGNOSIS — M25511 Pain in right shoulder: Secondary | ICD-10-CM | POA: Diagnosis not present

## 2024-05-02 ENCOUNTER — Encounter (INDEPENDENT_AMBULATORY_CARE_PROVIDER_SITE_OTHER): Admitting: Ophthalmology

## 2024-05-02 DIAGNOSIS — H43813 Vitreous degeneration, bilateral: Secondary | ICD-10-CM | POA: Diagnosis not present

## 2024-05-02 DIAGNOSIS — I10 Essential (primary) hypertension: Secondary | ICD-10-CM

## 2024-05-02 DIAGNOSIS — H353211 Exudative age-related macular degeneration, right eye, with active choroidal neovascularization: Secondary | ICD-10-CM | POA: Diagnosis not present

## 2024-05-02 DIAGNOSIS — H35033 Hypertensive retinopathy, bilateral: Secondary | ICD-10-CM

## 2024-05-02 DIAGNOSIS — H353122 Nonexudative age-related macular degeneration, left eye, intermediate dry stage: Secondary | ICD-10-CM | POA: Diagnosis not present

## 2024-05-04 DIAGNOSIS — M25511 Pain in right shoulder: Secondary | ICD-10-CM | POA: Diagnosis not present

## 2024-05-06 DIAGNOSIS — M25511 Pain in right shoulder: Secondary | ICD-10-CM | POA: Diagnosis not present

## 2024-05-08 DIAGNOSIS — M25511 Pain in right shoulder: Secondary | ICD-10-CM | POA: Diagnosis not present

## 2024-05-10 DIAGNOSIS — M25511 Pain in right shoulder: Secondary | ICD-10-CM | POA: Diagnosis not present

## 2024-05-17 DIAGNOSIS — M25511 Pain in right shoulder: Secondary | ICD-10-CM | POA: Diagnosis not present

## 2024-05-19 DIAGNOSIS — M25511 Pain in right shoulder: Secondary | ICD-10-CM | POA: Diagnosis not present

## 2024-05-29 DIAGNOSIS — M25511 Pain in right shoulder: Secondary | ICD-10-CM | POA: Diagnosis not present

## 2024-05-30 ENCOUNTER — Encounter (INDEPENDENT_AMBULATORY_CARE_PROVIDER_SITE_OTHER): Admitting: Ophthalmology

## 2024-05-30 DIAGNOSIS — H353122 Nonexudative age-related macular degeneration, left eye, intermediate dry stage: Secondary | ICD-10-CM

## 2024-05-30 DIAGNOSIS — H43813 Vitreous degeneration, bilateral: Secondary | ICD-10-CM | POA: Diagnosis not present

## 2024-05-30 DIAGNOSIS — I10 Essential (primary) hypertension: Secondary | ICD-10-CM

## 2024-05-30 DIAGNOSIS — H35033 Hypertensive retinopathy, bilateral: Secondary | ICD-10-CM | POA: Diagnosis not present

## 2024-05-30 DIAGNOSIS — H353211 Exudative age-related macular degeneration, right eye, with active choroidal neovascularization: Secondary | ICD-10-CM | POA: Diagnosis not present

## 2024-06-02 DIAGNOSIS — M25511 Pain in right shoulder: Secondary | ICD-10-CM | POA: Diagnosis not present

## 2024-06-15 DIAGNOSIS — M25511 Pain in right shoulder: Secondary | ICD-10-CM | POA: Diagnosis not present

## 2024-06-19 ENCOUNTER — Encounter: Payer: Self-pay | Admitting: Radiology

## 2024-06-22 DIAGNOSIS — Z85828 Personal history of other malignant neoplasm of skin: Secondary | ICD-10-CM | POA: Diagnosis not present

## 2024-06-22 DIAGNOSIS — L821 Other seborrheic keratosis: Secondary | ICD-10-CM | POA: Diagnosis not present

## 2024-06-28 ENCOUNTER — Encounter (INDEPENDENT_AMBULATORY_CARE_PROVIDER_SITE_OTHER): Admitting: Ophthalmology

## 2024-06-28 DIAGNOSIS — H35033 Hypertensive retinopathy, bilateral: Secondary | ICD-10-CM | POA: Diagnosis not present

## 2024-06-28 DIAGNOSIS — I10 Essential (primary) hypertension: Secondary | ICD-10-CM

## 2024-06-28 DIAGNOSIS — H353211 Exudative age-related macular degeneration, right eye, with active choroidal neovascularization: Secondary | ICD-10-CM | POA: Diagnosis not present

## 2024-06-28 DIAGNOSIS — H43813 Vitreous degeneration, bilateral: Secondary | ICD-10-CM

## 2024-06-28 DIAGNOSIS — H353122 Nonexudative age-related macular degeneration, left eye, intermediate dry stage: Secondary | ICD-10-CM | POA: Diagnosis not present

## 2024-07-25 ENCOUNTER — Encounter (INDEPENDENT_AMBULATORY_CARE_PROVIDER_SITE_OTHER): Admitting: Ophthalmology

## 2024-07-25 DIAGNOSIS — H35033 Hypertensive retinopathy, bilateral: Secondary | ICD-10-CM | POA: Diagnosis not present

## 2024-07-25 DIAGNOSIS — H353211 Exudative age-related macular degeneration, right eye, with active choroidal neovascularization: Secondary | ICD-10-CM | POA: Diagnosis not present

## 2024-07-25 DIAGNOSIS — H353122 Nonexudative age-related macular degeneration, left eye, intermediate dry stage: Secondary | ICD-10-CM | POA: Diagnosis not present

## 2024-07-25 DIAGNOSIS — H43813 Vitreous degeneration, bilateral: Secondary | ICD-10-CM | POA: Diagnosis not present

## 2024-07-25 DIAGNOSIS — I10 Essential (primary) hypertension: Secondary | ICD-10-CM | POA: Diagnosis not present

## 2024-08-22 ENCOUNTER — Encounter (INDEPENDENT_AMBULATORY_CARE_PROVIDER_SITE_OTHER): Admitting: Ophthalmology

## 2024-08-22 DIAGNOSIS — H35033 Hypertensive retinopathy, bilateral: Secondary | ICD-10-CM | POA: Diagnosis not present

## 2024-08-22 DIAGNOSIS — I10 Essential (primary) hypertension: Secondary | ICD-10-CM | POA: Diagnosis not present

## 2024-08-22 DIAGNOSIS — H353211 Exudative age-related macular degeneration, right eye, with active choroidal neovascularization: Secondary | ICD-10-CM | POA: Diagnosis not present

## 2024-08-22 DIAGNOSIS — H43813 Vitreous degeneration, bilateral: Secondary | ICD-10-CM | POA: Diagnosis not present

## 2024-08-22 DIAGNOSIS — H353122 Nonexudative age-related macular degeneration, left eye, intermediate dry stage: Secondary | ICD-10-CM

## 2024-09-19 ENCOUNTER — Encounter (INDEPENDENT_AMBULATORY_CARE_PROVIDER_SITE_OTHER): Admitting: Ophthalmology

## 2024-09-19 DIAGNOSIS — H353122 Nonexudative age-related macular degeneration, left eye, intermediate dry stage: Secondary | ICD-10-CM

## 2024-09-19 DIAGNOSIS — H43813 Vitreous degeneration, bilateral: Secondary | ICD-10-CM

## 2024-09-19 DIAGNOSIS — H35033 Hypertensive retinopathy, bilateral: Secondary | ICD-10-CM

## 2024-09-19 DIAGNOSIS — H353211 Exudative age-related macular degeneration, right eye, with active choroidal neovascularization: Secondary | ICD-10-CM

## 2024-09-19 DIAGNOSIS — I10 Essential (primary) hypertension: Secondary | ICD-10-CM

## 2024-10-17 ENCOUNTER — Encounter (INDEPENDENT_AMBULATORY_CARE_PROVIDER_SITE_OTHER): Admitting: Ophthalmology
